# Patient Record
Sex: Female | Born: 1970
Health system: Southern US, Community
[De-identification: ages and names within clinical notes are randomized; demographics above are authoritative.]

## PROBLEM LIST (undated history)

## (undated) DIAGNOSIS — H5000 Unspecified esotropia: Secondary | ICD-10-CM

## (undated) DIAGNOSIS — G43909 Migraine, unspecified, not intractable, without status migrainosus: Secondary | ICD-10-CM

## (undated) DIAGNOSIS — F32A Depression, unspecified: Secondary | ICD-10-CM

## (undated) DIAGNOSIS — F329 Major depressive disorder, single episode, unspecified: Secondary | ICD-10-CM

## (undated) DIAGNOSIS — H532 Diplopia: Secondary | ICD-10-CM

## (undated) DIAGNOSIS — F419 Anxiety disorder, unspecified: Secondary | ICD-10-CM

## (undated) DIAGNOSIS — J302 Other seasonal allergic rhinitis: Secondary | ICD-10-CM

## (undated) HISTORY — DX: Anxiety disorder, unspecified: F41.9

## (undated) HISTORY — DX: Major depressive disorder, single episode, unspecified: F32.9

## (undated) HISTORY — DX: Diplopia: H53.2

## (undated) HISTORY — DX: Depression, unspecified: F32.A

---
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
## 1999-10-28 ENCOUNTER — Encounter: Admission: RE | Admit: 1999-10-28 | Discharge: 1999-10-28 | Payer: Self-pay | Admitting: Obstetrics and Gynecology

## 1999-10-28 ENCOUNTER — Encounter: Payer: Self-pay | Admitting: Obstetrics and Gynecology

## 2000-01-04 ENCOUNTER — Other Ambulatory Visit: Admission: RE | Admit: 2000-01-04 | Discharge: 2000-01-04 | Payer: Self-pay | Admitting: Obstetrics & Gynecology

## 2000-04-30 ENCOUNTER — Encounter: Payer: Self-pay | Admitting: Obstetrics and Gynecology

## 2000-04-30 ENCOUNTER — Encounter: Admission: RE | Admit: 2000-04-30 | Discharge: 2000-04-30 | Payer: Self-pay | Admitting: Obstetrics and Gynecology

## 2000-08-01 ENCOUNTER — Inpatient Hospital Stay (HOSPITAL_COMMUNITY): Admission: AD | Admit: 2000-08-01 | Discharge: 2000-08-03 | Payer: Self-pay | Admitting: Obstetrics and Gynecology

## 2000-10-18 ENCOUNTER — Encounter: Payer: Self-pay | Admitting: Obstetrics and Gynecology

## 2000-10-18 ENCOUNTER — Encounter: Admission: RE | Admit: 2000-10-18 | Discharge: 2000-10-18 | Payer: Self-pay | Admitting: Obstetrics and Gynecology

## 2001-01-08 ENCOUNTER — Other Ambulatory Visit: Admission: RE | Admit: 2001-01-08 | Discharge: 2001-01-08 | Payer: Self-pay | Admitting: Obstetrics and Gynecology

## 2001-01-10 ENCOUNTER — Encounter: Payer: Self-pay | Admitting: Obstetrics and Gynecology

## 2001-01-10 ENCOUNTER — Encounter: Admission: RE | Admit: 2001-01-10 | Discharge: 2001-01-10 | Payer: Self-pay | Admitting: Obstetrics and Gynecology

## 2001-04-09 ENCOUNTER — Encounter: Admission: RE | Admit: 2001-04-09 | Discharge: 2001-04-09 | Payer: Self-pay | Admitting: Obstetrics and Gynecology

## 2001-04-09 ENCOUNTER — Encounter: Payer: Self-pay | Admitting: Obstetrics and Gynecology

## 2002-01-09 ENCOUNTER — Other Ambulatory Visit: Admission: RE | Admit: 2002-01-09 | Discharge: 2002-01-09 | Payer: Self-pay | Admitting: *Deleted

## 2002-12-03 ENCOUNTER — Inpatient Hospital Stay (HOSPITAL_COMMUNITY): Admission: AD | Admit: 2002-12-03 | Discharge: 2002-12-05 | Payer: Self-pay | Admitting: Obstetrics and Gynecology

## 2003-01-16 ENCOUNTER — Other Ambulatory Visit: Admission: RE | Admit: 2003-01-16 | Discharge: 2003-01-16 | Payer: Self-pay | Admitting: Obstetrics and Gynecology

## 2004-02-05 ENCOUNTER — Other Ambulatory Visit: Admission: RE | Admit: 2004-02-05 | Discharge: 2004-02-05 | Payer: Self-pay | Admitting: Obstetrics and Gynecology

## 2005-02-06 ENCOUNTER — Other Ambulatory Visit: Admission: RE | Admit: 2005-02-06 | Discharge: 2005-02-06 | Payer: Self-pay | Admitting: Obstetrics and Gynecology

## 2005-12-23 ENCOUNTER — Inpatient Hospital Stay (HOSPITAL_COMMUNITY): Admission: AD | Admit: 2005-12-23 | Discharge: 2005-12-25 | Payer: Self-pay | Admitting: Obstetrics and Gynecology

## 2006-12-23 ENCOUNTER — Emergency Department (HOSPITAL_COMMUNITY): Admission: EM | Admit: 2006-12-23 | Discharge: 2006-12-23 | Payer: Self-pay | Admitting: Emergency Medicine

## 2007-09-10 ENCOUNTER — Ambulatory Visit (HOSPITAL_BASED_OUTPATIENT_CLINIC_OR_DEPARTMENT_OTHER): Admission: RE | Admit: 2007-09-10 | Discharge: 2007-09-10 | Payer: Self-pay | Admitting: Orthopaedic Surgery

## 2007-09-10 HISTORY — PX: SHOULDER ARTHROSCOPY W/ LABRAL REPAIR: SHX2399

## 2008-03-06 HISTORY — PX: OTHER SURGICAL HISTORY: SHX169

## 2010-07-19 NOTE — Op Note (Signed)
Natalie Hurst, Natalie Hurst               ACCOUNT NO.:  [id number]   MEDICAL RECORD NO.:  [id number]          PATIENT TYPE:  AMB   LOCATION:  DSC                          FACILITY:  [facility]   PHYSICIAN:  Lubertha Basque. Dalldorf, M.D.DATE OF BIRTH:  [birth year]   DATE OF PROCEDURE:  DATE OF DISCHARGE:                               OPERATIVE REPORT   PREOPERATIVE DIAGNOSIS:  Left shoulder anterior labral tear.   POSTOPERATIVE DIAGNOSIS:  Left shoulder anterior labral tear.   PROCEDURE:  1. Left shoulder arthroscopic debridement.  2. Left shoulder arthroscopic labral repair.   ANESTHESIA:  General.   ATTENDING SURGEON:  Lubertha Basque. Jerl Santos, MD.   ASSISTANT SURGEON:  Lindwood Qua, PA   INDICATIONS FOR PROCEDURE:  The patient is a 40 year old woman who has  sustained three anterior dislocations of her shoulder at this point.  She has gone through a formal physical therapy program after  immobilization and despite this did suffer her second and third  dislocations.  She has a shoulder which is limiting to her in that she  is afraid it will come out taking care of her children and during  exercise.  She is offered arthroscopic repair.  Informed operative  consent was obtained after discussion of the possible complications of  reaction to anesthesia, infection, neurovascular injury, and recurrent  dislocations.   SUMMARY OF FINDINGS AND PROCEDURE:  Under general anesthesia, an  arthroscopy of left shoulder was performed.  Glenohumeral joint showed  no degenerative changes and the biceps tendon and rotator cuff were  intact.  She did have complete detachment of the anteroinferior labrum  which was flipped down into the axillary recess.  We performed a  debridement of the anteroinferior aspect of her glenoid and created a  bleeding bed of bone to which were repaired the labrum with two reverse  mattress sutures of FiberWire secured with Arthrex Pushlock anchors.  Bryna Colander assisted throughout  and was invaluable to the completion  of the case in that he helped position and retract and pass the  instruments while I performed the procedure.  His presence made this  case possible in an arthroscopic fashion.   DESCRIPTION OF PROCEDURE:  The patient was taken to the operating suite  where general anesthetic was applied without difficulty.  She was  positioned in beach-chair position and prepped and draped in normal  sterile fashion.  After administration of IV Kefzol, an arthroscopy of  the left shoulder was performed through a total of three portals.  Findings were as noted above.  Procedure consisted of debridement  followed by a labral repair.  We created a fresh bleeding bed of bone on  the anteroinferior aspect the labrum with a shaver and elevator and bur.  I located the anteroinferior glenohumeral ligament and we passed sutures  through this using the suture lasso device.  We made double passes  creating reverse mattress sutures x2 of #2 FiberWire.  I then advanced  this slightly in a superior direction and repaired with two of the 3.5-  mm push lock Arthrex anchors.  This seemed to invert the labrum  nicely  and created a nice bumper on the anteroinferior aspect of the glenoid.  Arthroscopic equipment was removed.  The shoulder was injected with  Marcaine with epinephrine and morphine.  Simple sutures of nylon was  used to loosely reapproximate the portals followed by Adaptic and dry  gauze dressing with tape.  Estimated blood loss and intraoperative  fluids obtained from anesthesia records.   DISPOSITION:  The patient was extubated in the operating room and taken  to recovery room in stable addition.  She was to go home same day and  follow the office closely.  I will contact her by phone tonight.      Lubertha Basque Jerl Santos, M.D.  Electronically Signed     PGD/MEDQ  D:  09/10/2007  T:  09/10/2007  Job:  [id number]

## 2010-07-22 NOTE — H&P (Signed)
NAME:  Natalie Hurst, Natalie Hurst                         ACCOUNT NO.:  [id number]   MEDICAL RECORD NO.:  [id number]                   PATIENT TYPE:  INP   LOCATION:  9167                                 FACILITY:  [facility]   PHYSICIAN:  Naima A. Dillard, M.D.              DATE OF BIRTH:  Jul 09, 1970   DATE OF ADMISSION:  12/03/2002  DATE OF DISCHARGE:                                HISTORY & PHYSICAL   HISTORY OF PRESENT ILLNESS:  Natalie Hurst is a 40 year old married white  female, G3, P2-0-0-2, at 2 and 3/7ths weeks, who presents with spontaneous  rupture of membranes at 9 p.m.  She reports uterine contractions that began  soon after her membranes ruptured.  She denies bleeding, headache, nausea,  vomiting, or visual disturbances.  Her pregnancy has been followed by the  [facility] Certified Nurse Midwife Service, and has been  remarkable for (1) history of depression, (2) positive group B strep, (3)  history of long cycles.  Her OB labs were collected on April 28, 2002.  Hemoglobin 11.4, hematocrit 34.9, platelets 258,000, blood type A+, antibody  negative, RPR nonreactive, rubella immune, hepatitis B surface antigen  negative.  Pap smear within normal limits in November of 2003.  Gonorrhea  negative.  Chlamydia negative.  Toxoplasmosis labs negative.  Maternal serum  alpha fetoprotein was within normal limits.  Her one hour Glucola on September 10, 2002 was 109, and hemoglobin at that time was 12.5.  Culture of the vaginal  tract for group B strep on November 06, 2002 was positive, and gonorrhea and  Chlamydia were negative from that same date.   HISTORY OF PRESENT PREGNANCY:  She presented for care at [facility] on  April 28, 2002 at approximately nine weeks gestation.  Pregnancy  ultrasonography at [redacted] weeks gestation showed growth consistent with previous  dating.  The rest of her prenatal care was unremarkable.   OBSTETRICAL HISTORY:  She is a gravida 3, para 2-0-0-2.  In  September of  1999, she had a vaginal birth for a female infant weighing 7 pounds and 12  ounces, at [redacted] weeks gestation after 21 hours in labor.  She had an epidural  for anesthesia.  His name was Natalie Hurst.  In May of 2002, she had a vaginal  delivery of a female infant weighing 7 pounds and 13 ounces at 40 and 3/7ths  weeks gestation after six hours in labor.  She had nothing for anesthesia.  The infant's name was Natalie Hurst.  She had positive group B strep with that  pregnancy.  She reports postpartum blues for eight weeks after her first  delivery, but she did not take medications for that.   ALLERGIES:  She has no medication allergies.  She has a side effect from  medication RONDEC which results in increased heart rate.   MEDICAL HISTORY:  She reports having had the usual childhood illnesses.  SURGICAL HISTORY:  Remarkable for wisdom teeth extraction.   FAMILY MEDICAL HISTORY:  Remarkable for maternal grandfather with history of  MI.  Father and mother with hypertension.  Paternal grandfather with type 2  diabetes.  Maternal aunt with a tumor on her neck.  Mother with epilepsy as  a child.  Depression runs in her family.   GENETIC HISTORY:  Remarkable for maternal great uncle with multiple  sclerosis.   SOCIAL HISTORY:  The patient is married to the father of the baby.  His name  is Natalie Hurst.  He is involved and supportive.  The patient and father of the baby  are both college educated and employed.  They are of the Catholic faith.  They deny any alcohol, tobacco, or illicit drug use with the pregnancy.   OBJECTIVE DATA:  VITAL SIGNS:  Stable.  She is afebrile.  HEENT:  Grossly within normal limits.  CHEST:  Clear to auscultation.  HEART:  Regular rate and rhythm.  ABDOMEN:  Gravid and contoured with fundal height extending approximately 40  cm above the pubic symphysis.  Fetal heart rate is reassuring.  Uterine  contractions every 2-3 minutes.  PELVIC:  Cervix is 6-7 cm per R.N. with  copious thin meconium-stained fluid  present.  EXTREMITIES:  Within normal limits.   ASSESSMENT:  1. Intrauterine pregnancy at term.  2. Spontaneous rupture of membranes.  3. Active labor.   PLAN:  1. Admit to birthing suites for consult with Dr. Normand Sloop.  2. Routine CNM orders.  3. Ampicillin for group B strep prophylaxis.     Natalie Hurst, C.N.M.                     Naima A. Normand Sloop, M.D.    KS/MEDQ  D:  12/03/2002  T:  12/03/2002  Job:  [id number]

## 2010-07-22 NOTE — H&P (Signed)
[facility]  Patient:    Natalie Hurst, Natalie Hurst                        MRN: [id number] Adm. Date:  08/01/00 Attending:  Janine Limbo, M.D. Dictator:   Mack Guise, C.N.M.                         History and Physical                                [name] is a 40 year old gravida 2, para 1-0-0-1 at 68 5/7 weeks, EDD Jul 27, 2000 as determined by LMP dates confirmed with pregnancy ultrasonography.  She presents with increasing signs and symptoms of labor.  Reports positive fetal movement.  No bleeding.  No rupture of membranes.  Denies any headache, visual changes, or epigastric pain.  Her pregnancy has been followed by the C.N.M. service at [facility] and is remarkable for left breast mass, history of depression, group B strep positive.  This patient was initially evaluated at the office of [facility] on January 04, 2000 at approximately 7 1/[redacted] weeks gestation.  Her hemoglobin and hematocrit 12.0 and 35.6, platelets 275,000.  Blood type and Rh A+.  Antibody screen negative. Toxo titers negative.  VDRL nonreactive.  Rubella immune.  Hepatitis B surface antigen negative.  Pap smear within normal limits.  GC and chlamydia negative and negative at AFP/free beta hCG within normal range on March 1, 200_ at 28 weeks.  One hour glucose challenge 83.  Hemoglobin 10.4.  At 36 weeks the culture of the vaginal tract is positive for group B strep.  Patient has been followed for a left breast mass.  She was seen initially by Dr. Gerrit Friends prior to pregnancy and has been followed throughout with followup ultrasounds.  By ultrasound of the breast is felt to be a probable fibroadenoma and has not caused the patient any further problems.  Her pregnancy has been unremarkable with size being equal to dates.  Throughout her pregnancy she has been normotensive with no proteinuria.  PAST OBSTETRICAL HISTORY:     In 1999 normal spontaneous vaginal delivery with the birth of a 7 pound  12 ounce female infant with no complications.  PAST MEDICAL HISTORY:         History of postpartum depression 10 years ago not taking any medications at the present time.  ALLERGIES:                    No known drug allergies.  SOCIAL HISTORY:               Patient denies the use of tobacco, alcohol, or illicit drugs.  FAMILY HISTORY:               Maternal grandfather and paternal grandfather with heart disease and stroke.  Patients father and maternal grandmother with hypertension.  Paternal grandfather with diabetes.  Patients sister is hypothyroid.  Her aunt is hyperthyroid.  Patients mother with a history of epilepsy as a child.  Paternal grandmother with arthritis.  Patients maternal aunt has a tumor at the base of her neck.  PAST SURGICAL HISTORY:        Wisdom teeth.  GENETIC HISTORY:              There is no family history of genetic or  chromosomal disorders, children that died in infancy or that were born with birth defects.                                Natalie Hurst is a 40 year old Caucasian female. She is married to [employer].  He is involved and supportive.  They are of the Catholic faith.  REVIEW OF SYSTEMS:            There are no signs or symptoms suggestive of focal or systemic disease and the patient is typical of one with a uterine pregnancy at term in active labor.  PHYSICAL EXAMINATION  VITAL SIGNS:                  Stable, afebrile.  HEENT:                        Unremarkable.  HEART:                        Regular rate and rhythm.  LUNGS:                        Clear.  ABDOMEN:                      Gravid in its contour.  Uterine fundus is noted to extend 40 cm above the level of the pubic symphysis.  ______ maneuver finds the infant to be in a longitudinal lie, cephalic presentation, and the estimated fetal weight is 8 pounds.  PELVIC:                       Digital examination of the cervix finds it to be 6 cm dilated, 90% effaced with the  cephalic presenting part at a -1 station with a bulging bag of waters.  The baseline of the fetal heart rate monitor is reactive and reassuring.  Patient is contracting every three to four minutes.  EXTREMITIES:                  Edema 1+.  DTRs are 1+ with no clonus.  ASSESSMENT:                   Intrauterine pregnancy at term, active labor.  PLAN:                         Admit per Dr. Marline Backbone.  Routine C.N.M. orders.  Penicillin-G prophylaxis for positive group B strep.  Anticipate spontaneous vaginal delivery. DD:  08/01/00 TD:  08/01/00 Job: [id number]

## 2010-07-22 NOTE — H&P (Signed)
Natalie Hurst, Natalie Hurst               ACCOUNT NO.:  [id number]   MEDICAL RECORD NO.:  [id number]          PATIENT TYPE:  MAT   LOCATION:  MATC                          FACILITY:  [facility]   PHYSICIAN:  Crist Fat. Rivard, M.D. DATE OF BIRTH:  15-Jul-1970   DATE OF ADMISSION:  12/23/2005  DATE OF DISCHARGE:                                HISTORY & PHYSICAL   HISTORY OF PRESENT ILLNESS:  This is a 40 year old gravida 5, para 3-1-0-3  at 40-1/7 weeks who presents with contractions every 5 minutes for 3 hours.  She denies leaking or bleeding and reports positive fetal movement.  The  pregnancy has been followed by the Nurse Midwife Service and remarkable for:  1. History of depression.  2. History of long cycles.  3. History of fibroadenoma.  4. Recent EAV for multiple anomaly.  There are limited records available with this pregnancy at this time.   REVIEW OF SYSTEMS:  The patient reports contractions for 3 hours with no  leaking of fluid and no bleeding.   HISTORY OF CURRENT PREGNANCY:  The patient entered care at 10-weeks  gestation.  She had first trimester genetic screen at [facility], which was  normal.  She had an 18-week anatomy ultrasound, which was normal and  __________  was normal also.  She had a Glucola done at 28-weeks, which was  96.  She had a group B Strep, which was negative at term.  Her OB history is  remarkable for vaginal birth in 1999 of a female infant weighting 7 lb, 12 oz  at 38-weeks gestation, named Lorriane Shire.  She had a vaginal delivery in 2002 of  a female infant weighing 7 lb, 13 oz at 40-3/[redacted] weeks gestation named Julia.  She had some postpartum blues after that pregnancy.  She had a vaginal  delivery in 2004 at 40-3/[redacted] weeks gestation with no complications.  She had  an induced abortion in 2006 for multiple anomalies at 21 weeks at [facility] and has done well coping since then and presents today in labor.   ALLERGIES:  None; however, RONDEC results in increased heart  rate.   PAST MEDICAL HISTORY:  Unremarkable.   PAST SURGICAL HISTORY:  Remarkable for wisdom teeth extraction.   FAMILY HISTORY:  Remarkable for grandfather with MI, father and mother with  hypertension, grandfather with diabetes, aunt with neck tumor, mother with  epilepsy as a child and depression runs in her family.   GENETIC HISTORY:  Is remarkable for a maternal great-uncle with multiple  sclerosis and her last pregnancy with multiple anomalies.   SOCIAL HISTORY:  The patient is married to Theodis Shove who is involved and  supportive.  They are both college-educated and employed.  She is of the  [employer].  She denies any alcohol, tobacco or drug use.   OBJECTIVE:  VITAL SIGNS:  Stable.  Afebrile.  HEENT:  Within normal limits.  NECK:  Thyroid normal, not enlarged.  CHEST:  Clear to auscultation.  HEART:  Regular rate and rhythm.  ABDOMEN:  Gravid at __________  shows reactive fetal heartbeat with  contractions every 5 minutes.  Cervix is 5 cm, 70% effaced, minus 1 station  with a vertex presentation.  EXTREMITIES:  Within normal limits.   ASSESSMENT:  1. Intrauterine pregnancy at 40 and 1/7 weeks.  2. Active labor.  3. Group B Strep negative.   PLAN:  1. Admit to birthing suite.  Dr. Estanislado Pandy notified.  2. Routine __________ .  3. Consider amniotomy.  4. May want epidural.      Marie L. Williams, C.N.M.      Crist Fat Rivard, M.D.  Electronically Signed    MLW/MEDQ  D:  12/23/2005  T:  12/24/2005  Job:  [id number]

## 2010-12-01 LAB — POCT HEMOGLOBIN-HEMACUE: Hemoglobin: 13.3

## 2011-05-09 ENCOUNTER — Other Ambulatory Visit: Payer: Self-pay | Admitting: Obstetrics and Gynecology

## 2011-05-09 DIAGNOSIS — Z1231 Encounter for screening mammogram for malignant neoplasm of breast: Secondary | ICD-10-CM

## 2011-06-19 ENCOUNTER — Ambulatory Visit
Admission: RE | Admit: 2011-06-19 | Discharge: 2011-06-19 | Disposition: A | Discharge status: Home / Self Care | Payer: BC Managed Care – PPO | Source: Ambulatory Visit | Attending: Obstetrics and Gynecology | Admitting: Obstetrics and Gynecology

## 2011-06-19 DIAGNOSIS — Z1231 Encounter for screening mammogram for malignant neoplasm of breast: Secondary | ICD-10-CM

## 2011-07-27 ENCOUNTER — Ambulatory Visit: Payer: Self-pay | Admitting: Obstetrics and Gynecology

## 2011-07-27 ENCOUNTER — Ambulatory Visit (INDEPENDENT_AMBULATORY_CARE_PROVIDER_SITE_OTHER): Payer: BC Managed Care – PPO | Admitting: Obstetrics and Gynecology

## 2011-07-27 ENCOUNTER — Encounter: Payer: Self-pay | Admitting: Obstetrics and Gynecology

## 2011-07-27 VITALS — BP 90/62 | HR 68 | Resp 16 | Ht 63.0 in | Wt 156.0 lb

## 2011-07-27 DIAGNOSIS — Z124 Encounter for screening for malignant neoplasm of cervix: Secondary | ICD-10-CM

## 2011-07-27 DIAGNOSIS — N921 Excessive and frequent menstruation with irregular cycle: Secondary | ICD-10-CM

## 2011-07-27 DIAGNOSIS — Z139 Encounter for screening, unspecified: Secondary | ICD-10-CM

## 2011-07-27 NOTE — Progress Notes (Signed)
Contraception vasectomy Last pap 07/2010 wnl Last Mammo 06/19/2011 wnl Last Colonoscopy none Last Dexa Scan none Primary MD Catha Gosselin Abuse at Home none  No complaints but reports her last cycle was only 2 days compared to 5-7 days and are normal he is.  Her husband has a vasectomy but never got E. followup testing done.  Filed Vitals:   07/27/11 1441  BP: 90/62  Pulse: 68  Resp: 16   ROS: noncontributory  Physical Examination: General appearance - alert, well appearing, and in no distress Neck - supple, no significant adenopathy Chest - clear to auscultation, no wheezes, rales or rhonchi, symmetric air entry Heart - normal rate and regular rhythm Abdomen - soft, nontender, nondistended, no masses or organomegaly Breasts - breasts appear normal, no suspicious masses, no skin or nipple changes or axillary nodes Pelvic - normal external genitalia, vulva, vagina, cervix, uterus and adnexa Back exam - no CVAT Extremities - no edema, redness or tenderness in the calves or thighs  Assessment and plan Metrorrhagia check labs Will call patient tomorrow if positive pregnancy test Patient instructed to have husband do his followup testing or Pap smear done If bleeding issues persist, rto sooner RTO for AEX

## 2011-07-28 ENCOUNTER — Telehealth: Payer: Self-pay | Admitting: Obstetrics and Gynecology

## 2011-07-28 LAB — CBC
Hemoglobin: 12.3 g/dL (ref 12.0–15.0)
MCH: 29.4 pg (ref 26.0–34.0)
MCHC: 32.8 g/dL (ref 30.0–36.0)
Platelets: 322 10*3/uL (ref 150–400)
RDW: 13.4 % (ref 11.5–15.5)

## 2011-07-28 LAB — HCG, QUANTITATIVE, PREGNANCY: hCG, Beta Chain, Quant, S: <2 m[IU]/mL

## 2011-07-28 LAB — TSH: TSH: 1.859 u[IU]/mL (ref 0.350–4.500)

## 2011-07-28 LAB — VITAMIN D 25 HYDROXY (VIT D DEFICIENCY, FRACTURES): Vit D, 25-Hydroxy: 38 ng/mL (ref 30–89)

## 2011-07-28 NOTE — Telephone Encounter (Signed)
TC to pt. Per Dr Alinda Sierras informed pregnancy test negative.

## 2011-08-02 LAB — PAP IG W/ RFLX HPV ASCU

## 2012-05-20 ENCOUNTER — Other Ambulatory Visit: Payer: Self-pay

## 2012-05-20 DIAGNOSIS — Z1231 Encounter for screening mammogram for malignant neoplasm of breast: Secondary | ICD-10-CM

## 2012-06-21 ENCOUNTER — Ambulatory Visit
Admission: RE | Admit: 2012-06-21 | Discharge: 2012-06-21 | Disposition: A | Discharge status: Home / Self Care | Payer: BC Managed Care – PPO | Source: Ambulatory Visit

## 2012-06-21 DIAGNOSIS — Z1231 Encounter for screening mammogram for malignant neoplasm of breast: Secondary | ICD-10-CM

## 2013-05-21 ENCOUNTER — Other Ambulatory Visit: Payer: Self-pay

## 2013-05-21 DIAGNOSIS — Z1231 Encounter for screening mammogram for malignant neoplasm of breast: Secondary | ICD-10-CM

## 2013-06-27 ENCOUNTER — Encounter (INDEPENDENT_AMBULATORY_CARE_PROVIDER_SITE_OTHER): Payer: Self-pay

## 2013-06-27 ENCOUNTER — Ambulatory Visit
Admission: RE | Admit: 2013-06-27 | Discharge: 2013-06-27 | Disposition: A | Discharge status: Home / Self Care | Payer: Self-pay | Source: Ambulatory Visit

## 2013-06-27 DIAGNOSIS — Z1231 Encounter for screening mammogram for malignant neoplasm of breast: Secondary | ICD-10-CM

## 2014-01-07 ENCOUNTER — Other Ambulatory Visit: Payer: Self-pay | Admitting: Family Medicine

## 2014-01-07 ENCOUNTER — Ambulatory Visit
Admission: RE | Admit: 2014-01-07 | Discharge: 2014-01-07 | Disposition: A | Discharge status: Home / Self Care | Payer: No Typology Code available for payment source | Source: Ambulatory Visit | Attending: Family Medicine | Admitting: Family Medicine

## 2014-01-07 DIAGNOSIS — R52 Pain, unspecified: Secondary | ICD-10-CM

## 2014-01-16 ENCOUNTER — Ambulatory Visit: Payer: No Typology Code available for payment source | Attending: Family Medicine

## 2014-01-16 DIAGNOSIS — M25659 Stiffness of unspecified hip, not elsewhere classified: Secondary | ICD-10-CM | POA: Diagnosis not present

## 2014-01-16 DIAGNOSIS — R5381 Other malaise: Secondary | ICD-10-CM | POA: Insufficient documentation

## 2014-01-16 DIAGNOSIS — M545 Low back pain: Secondary | ICD-10-CM | POA: Diagnosis present

## 2014-01-21 ENCOUNTER — Ambulatory Visit: Payer: No Typology Code available for payment source | Admitting: Physical Therapy

## 2014-01-21 DIAGNOSIS — M545 Low back pain: Secondary | ICD-10-CM | POA: Diagnosis not present

## 2014-01-27 ENCOUNTER — Ambulatory Visit: Payer: No Typology Code available for payment source | Admitting: Physical Therapy

## 2014-01-27 DIAGNOSIS — M545 Low back pain: Secondary | ICD-10-CM | POA: Diagnosis not present

## 2014-02-03 ENCOUNTER — Ambulatory Visit: Payer: No Typology Code available for payment source | Attending: Family Medicine | Admitting: Physical Therapy

## 2014-02-03 DIAGNOSIS — M25659 Stiffness of unspecified hip, not elsewhere classified: Secondary | ICD-10-CM | POA: Diagnosis not present

## 2014-02-03 DIAGNOSIS — R5381 Other malaise: Secondary | ICD-10-CM | POA: Insufficient documentation

## 2014-02-03 DIAGNOSIS — M545 Low back pain: Secondary | ICD-10-CM | POA: Diagnosis present

## 2014-02-05 ENCOUNTER — Ambulatory Visit: Payer: No Typology Code available for payment source | Admitting: Physical Therapy

## 2014-02-05 DIAGNOSIS — M545 Low back pain: Secondary | ICD-10-CM | POA: Diagnosis not present

## 2014-02-10 ENCOUNTER — Ambulatory Visit: Payer: No Typology Code available for payment source | Admitting: Physical Therapy

## 2014-02-10 DIAGNOSIS — M545 Low back pain: Secondary | ICD-10-CM | POA: Diagnosis not present

## 2014-02-12 ENCOUNTER — Ambulatory Visit: Payer: No Typology Code available for payment source | Admitting: Physical Therapy

## 2014-02-12 DIAGNOSIS — M545 Low back pain: Secondary | ICD-10-CM | POA: Diagnosis not present

## 2014-02-17 ENCOUNTER — Ambulatory Visit: Payer: No Typology Code available for payment source | Admitting: Physical Therapy

## 2014-02-17 DIAGNOSIS — M545 Low back pain: Secondary | ICD-10-CM | POA: Diagnosis not present

## 2014-02-19 ENCOUNTER — Ambulatory Visit: Payer: No Typology Code available for payment source | Admitting: Physical Therapy

## 2014-06-25 ENCOUNTER — Other Ambulatory Visit: Payer: Self-pay

## 2014-06-25 DIAGNOSIS — Z1231 Encounter for screening mammogram for malignant neoplasm of breast: Secondary | ICD-10-CM

## 2014-06-30 ENCOUNTER — Ambulatory Visit
Admission: RE | Admit: 2014-06-30 | Discharge: 2014-06-30 | Disposition: A | Discharge status: Home / Self Care | Payer: No Typology Code available for payment source | Source: Ambulatory Visit

## 2014-06-30 DIAGNOSIS — Z1231 Encounter for screening mammogram for malignant neoplasm of breast: Secondary | ICD-10-CM

## 2014-09-17 ENCOUNTER — Other Ambulatory Visit: Payer: Self-pay | Admitting: Family Medicine

## 2014-09-17 DIAGNOSIS — M545 Low back pain: Secondary | ICD-10-CM

## 2014-09-25 ENCOUNTER — Ambulatory Visit
Admission: RE | Admit: 2014-09-25 | Discharge: 2014-09-25 | Disposition: A | Discharge status: Home / Self Care | Payer: No Typology Code available for payment source | Source: Ambulatory Visit | Attending: Family Medicine | Admitting: Family Medicine

## 2014-09-25 DIAGNOSIS — M545 Low back pain: Secondary | ICD-10-CM

## 2015-05-10 ENCOUNTER — Other Ambulatory Visit: Payer: Self-pay | Admitting: Ophthalmology

## 2015-05-10 DIAGNOSIS — R51 Headache: Secondary | ICD-10-CM

## 2015-05-10 DIAGNOSIS — R519 Headache, unspecified: Secondary | ICD-10-CM

## 2015-05-10 DIAGNOSIS — H532 Diplopia: Secondary | ICD-10-CM

## 2015-05-16 ENCOUNTER — Ambulatory Visit
Admission: RE | Admit: 2015-05-16 | Discharge: 2015-05-16 | Disposition: A | Discharge status: Home / Self Care | Payer: 59 | Source: Ambulatory Visit | Attending: Ophthalmology | Admitting: Ophthalmology

## 2015-05-16 DIAGNOSIS — R519 Headache, unspecified: Secondary | ICD-10-CM

## 2015-05-16 DIAGNOSIS — R51 Headache: Secondary | ICD-10-CM

## 2015-05-16 DIAGNOSIS — H532 Diplopia: Secondary | ICD-10-CM

## 2015-05-16 MED ORDER — GADOBENATE DIMEGLUMINE 529 MG/ML IV SOLN
14.0000 mL | Freq: Once | INTRAVENOUS | Status: AC | PRN
  Administered 2015-05-16: 14 mL via INTRAVENOUS

## 2015-05-27 ENCOUNTER — Encounter: Payer: Self-pay | Admitting: Neurology

## 2015-05-27 ENCOUNTER — Ambulatory Visit (INDEPENDENT_AMBULATORY_CARE_PROVIDER_SITE_OTHER): Payer: 59 | Admitting: Neurology

## 2015-05-27 VITALS — BP 114/72 | HR 77 | Ht 63.0 in | Wt 163.0 lb

## 2015-05-27 DIAGNOSIS — H532 Diplopia: Secondary | ICD-10-CM

## 2015-05-27 DIAGNOSIS — G441 Vascular headache, not elsewhere classified: Secondary | ICD-10-CM

## 2015-05-27 DIAGNOSIS — R519 Headache, unspecified: Secondary | ICD-10-CM | POA: Insufficient documentation

## 2015-05-27 DIAGNOSIS — R51 Headache: Secondary | ICD-10-CM

## 2015-05-27 MED ORDER — TOPIRAMATE 25 MG PO TABS: ORAL_TABLET | ORAL | Status: DC

## 2015-05-27 NOTE — Patient Instructions (Signed)
   Topamax (topiramate) is a seizure medication that has an FDA approval for seizures and for migraine headache. Potential side effects of this medication include weight loss, cognitive slowing, tingling in the fingers and toes, and carbonated drinks will taste bad. If any significant side effects are noted on this drug, please contact our office.  Diplopia Diplopia is the condition of having double vision or seeing two of a single object. There are many causes of diplopia. Some are not dangerous and can be easily corrected. Diplopia may also be a symptom of a serious medical problem. There are two types of diplopia.  Monocular diplopia. This is double vision that affects only one eye. Monocular diplopia is often caused by a clouding of the lens in your eye (cataract) or by disruptions in the way that your eye focuses light.  Binocular diplopia. This is double vision that affects both eyes. However, when you shut one eye, the double vision will go away. Binocular diplopia may be more serious. It can be caused by:  Problems with the nerves or muscles that are responsible for eye movement.  Neurologic diseases.  Thyroid problems.  Tumors.  An infection near your eyes.  A stroke. You may need to see a health care provider who specializes in eye conditions (ophthalmologist) or a nerve specialist (neurologist) to find the cause. HOME CARE INSTRUCTIONS  Tell your health care provider about any changes in your vision.  Do not drive or operate heavy machinery if diplopia interferes with your vision.  Keep all follow-up visits as directed by your health care provider. This is important. SEEK MEDICAL CARE IF:  Your diplopia gets worse.  You develop any other symptoms along with your diplopia, such as:  Weakness.  Numbness.  Headache.  Eye pain.  Clumsiness.  Nausea.  Drooping eyelids.  Abnormal movement of one of your eyes. SEEK IMMEDIATE MEDICAL CARE IF:  You have sudden  vision loss.  You suddenly get a very bad headache.  You have sudden weakness or numbness.  You suddenly lose the ability to speak, understand speech, or both.   This information is not intended to replace advice given to you by your health care provider. Make sure you discuss any questions you have with your health care provider.   Document Released: 12/23/2003 Document Revised: 07/07/2014 Document Reviewed: 01/14/2014 Elsevier Interactive Patient Education [employer]2016 [employer]

## 2015-05-27 NOTE — Progress Notes (Signed)
Reason for visit: Headache  Referring physician: Dr. [name] is a 45 y.o. female  History of present illness:  Natalie Hurst is a 45 year old right-handed white female with a history of congenital diplopia. The patient has had horizontal double vision throughout her entire life, usually worse when looking to the left, better when looking to the right. The patient has not had any functional limitations because of the double vision. She is able to operate a motor vehicle, watch TV, and read a book without too much difficulty. The patient denies any ptosis with the double vision, and no issues with chewing or swallowing. The patient has had some slight worsening of the double vision over time. She also has noted some intermittent headaches over the last 2 years, the headaches are generally in the right parietal area, occasionally with some spread of pain into the neck and shoulder area. The episodes were initially occurring once every 2 months or so, but in December 2016, the headaches converted to being daily in nature. They do not usually keep her from any activities of daily living. The patient has no photophobia or phonophobia with the headache, and no reports of nausea or vomiting. She does have some slight scalp tenderness. The patient denies any new numbness or weakness of the face, arms, or legs. She does have a history of right carpal tunnel syndrome surgery, she has some symptoms of carpal tunnel syndrome on the left hand. The patient reports some achy sensations in the left calf at night and some occasional tingling in the left foot. The patient has had MRI evaluation of the brain that shows evidence of a cisterna magna. Otherwise, the brain was normal. The patient comes to this office for an evaluation. She has noted that over-the-counter medication such as Tylenol or Advil do not help her headache. She has some chronic mild balance issues that have been present for her entire  life. She denies any issues controlling the bowels or the bladder.  Past Medical History  Diagnosis Date  . Anxiety   . Depression   . Diplopia 05/27/2015  . Headache 05/27/2015    Past Surgical History  Procedure Laterality Date  . Right wrist surgery    . Left shoulder surgery      Family History  Problem Relation Age of Onset  . Hypertension Mother   . Thyroid disease Mother   . Hypertension Father   . Heart disease Father   . Hypertension Brother   . Thyroid disease Sister     Social history:  reports that she has never smoked. She does not have any smokeless tobacco history on file. She reports that she drinks alcohol. She reports that she does not use illicit drugs.  Medications:  Prior to Admission medications   Medication Sig Start Date End Date Taking? Authorizing Provider  cetirizine (ZYRTEC) 10 MG tablet Take 10 mg by mouth daily.   Yes Historical Provider, MD  FLUoxetine (PROZAC) 20 MG capsule Take 20 mg by mouth daily.   Yes Historical Provider, MD      Allergies  Allergen Reactions  . Amoxicillin     Hives   . Other     Rondec- SO, heart races.     ROS:  Out of a complete 14 system review of symptoms, the patient complains only of the following symptoms, and all other reviewed systems are negative.  Birthmarks, moles Double vision Allergies Headache Depression, anxiety Restless legs  Blood pressure  114/72, pulse 77, height 5\' 3"  (1.6 m), weight 163 lb (73.936 kg).  Physical Exam  General: The patient is alert and cooperative at the time of the examination.  Eyes: Pupils are equal, round, and reactive to light. Discs are flat bilaterally.  Neck: The neck is supple, no carotid bruits are noted.  Respiratory: The respiratory examination is clear.  Cardiovascular: The cardiovascular examination reveals a regular rate and rhythm, no obvious murmurs or rubs are noted.   Neuromuscular: Range of movement of the cervical spine is full. Oh  crepitus is noted in the temporomandibular joints.  Skin: Extremities are without significant edema.  Neurologic Exam  Mental status: The patient is alert and oriented x 3 at the time of the examination. The patient has apparent normal recent and remote memory, with an apparently normal attention span and concentration ability.  Cranial nerves: Facial symmetry is present. There is good sensation of the face to pinprick and soft touch bilaterally. The strength of the facial muscles and the muscles to head turning and shoulder shrug are normal bilaterally. Speech is well enunciated, no aphasia or dysarthria is noted. Extraocular movements are full, but on primary gaze, there is slight medial deviation of the left eye. With extreme right gaze, there is an gaze nystagmus of the right eye. Visual fields are full. The tongue is midline, and the patient has symmetric elevation of the soft palate. No obvious hearing deficits are noted.  Motor: The motor testing reveals 5 over 5 strength of all 4 extremities. Good symmetric motor tone is noted throughout.  Sensory: Sensory testing is intact to pinprick, soft touch, vibration sensation, and position sense on all 4 extremities. No evidence of extinction is noted.  Coordination: Cerebellar testing reveals good finger-nose-finger and heel-to-shin bilaterally.  Gait and station: Gait is normal. Tandem gait is normal. Romberg is negative. No drift is seen.  Reflexes: Deep tendon reflexes are symmetric and normal bilaterally. Toes are downgoing bilaterally.   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 Otherwise unremarkable appearance of the brain.  * MRI scan images were reviewed online. I agree with the written report.     Assessment/Plan:  1. Chronic daily headache  2. Congenital diplopia  The patient indicates that there has been some slight worsening of her double vision has  occurred over the last several years. We will check blood work to ensure that there is not an underlying issue affecting the double vision. The patient is now having daily headaches, we will initiate treatment with Topamax, follow-up in about 3 months. The patient will contact our office if she has had problems with toleration of the medication or if the medication is ineffective.  Marlan Palau. Keith Ladajah Soltys MD 05/27/2015 7:35 PM  [facility] [address] [place], [place] [id number]  Phone [phone] Fax [phone]

## 2015-05-31 LAB — ACETYLCHOLINE RECEPTOR, BINDING: AChR Binding Ab, Serum: <0.03 nmol/L (ref 0.00–0.24)

## 2015-05-31 LAB — VITAMIN B12: VITAMIN B 12: 309 pg/mL (ref 211–946)

## 2015-05-31 LAB — TSH: TSH: 2.29 u[IU]/mL (ref 0.450–4.500)

## 2015-06-01 ENCOUNTER — Telehealth: Payer: Self-pay | Admitting: *Deleted

## 2015-06-01 NOTE — Telephone Encounter (Signed)
Spoke to pt and relayed results of labs.   She verbalized understanding.   She did relay that she noted drowsiness and constipation with taking the topamax.  She will monitor and see how she does.  Will call back prn.

## 2015-06-01 NOTE — Telephone Encounter (Signed)
-----   Message from York Spanielharles K Willis, MD sent at 05/31/2015  4:46 PM EDT -----  The blood work results are unremarkable. Please call the patient.  ----- Message -----    From: [facility] Lab Results In Interface    Sent: 05/28/2015   9:39 AM      To: York Spanielharles K Willis, MD

## 2015-08-20 ENCOUNTER — Other Ambulatory Visit: Payer: Self-pay | Admitting: Family Medicine

## 2015-08-20 DIAGNOSIS — R911 Solitary pulmonary nodule: Secondary | ICD-10-CM

## 2015-08-30 ENCOUNTER — Ambulatory Visit
Admission: RE | Admit: 2015-08-30 | Discharge: 2015-08-30 | Disposition: A | Discharge status: Home / Self Care | Payer: 59 | Source: Ambulatory Visit | Attending: Family Medicine | Admitting: Family Medicine

## 2015-08-30 ENCOUNTER — Other Ambulatory Visit: Payer: Self-pay | Admitting: Obstetrics and Gynecology

## 2015-08-30 DIAGNOSIS — R928 Other abnormal and inconclusive findings on diagnostic imaging of breast: Secondary | ICD-10-CM

## 2015-08-30 DIAGNOSIS — R911 Solitary pulmonary nodule: Secondary | ICD-10-CM

## 2015-08-30 MED ORDER — IOPAMIDOL (ISOVUE-300) INJECTION 61%
75.0000 mL | Freq: Once | INTRAVENOUS | Status: AC | PRN
  Administered 2015-08-30: 75 mL via INTRAVENOUS

## 2015-09-01 NOTE — H&P (Signed)
Natalie Hurst is an 45 y.o. female.   Chief Complaint: persistent congenital incommitant esotropia with diplopia. HPI: patient complains of double vision, headaches and esotropia.  Past Medical History  Diagnosis Date  . Anxiety   . Depression   . Diplopia 05/27/2015  . Headache 05/27/2015    Past Surgical History  Procedure Laterality Date  . Right wrist surgery    . Left shoulder surgery      Family History  Problem Relation Age of Onset  . Hypertension Mother   . Thyroid disease Mother   . Hypertension Father   . Heart disease Father   . Hypertension Brother   . Thyroid disease Sister    Social History:  reports that she has never smoked. She does not have any smokeless tobacco history on file. She reports that she drinks alcohol. She reports that she does not use illicit drugs.  Allergies:  Allergies  Allergen Reactions  . Amoxicillin     Hives   . Other     Rondec- SO, heart races.     No prescriptions prior to admission    No results found for this or any previous visit (from the past 48 hour(s)). Ct Chest W Contrast  08/30/2015  CLINICAL DATA:  Pulmonary nodule on chest x-ray. EXAM: CT CHEST WITH CONTRAST TECHNIQUE: Multidetector CT imaging of the chest was performed during intravenous contrast administration. CONTRAST:  75mL ISOVUE-300 IOPAMIDOL (ISOVUE-300) INJECTION 61% COMPARISON:  Chest x-ray 08/19/2015. FINDINGS: Mediastinum / Lymph Nodes: There is no axillary lymphadenopathy. No mediastinal lymphadenopathy. There is no hilar lymphadenopathy. The heart size is normal. No pericardial effusion. The esophagus has normal imaging features. Lungs / Pleura: No evidence for focal airspace consolidation. No pulmonary nodule or mass. No pulmonary edema or pleural effusion. The opacity in the right cardiophrenic angle on the recent chest x-ray is secondary to prominent epicardial fat pad. Upper Abdomen:  Unremarkable. MSK / Soft Tissues: Bone windows reveal no worrisome  lytic or sclerotic osseous lesions. IMPRESSION: Normal CT evaluation of the chest. The opacity in the right cardiophrenic angle seen on the recent chest x-ray is secondary to an epicardial fat pad. Electronically Signed   By: Kennith CenterEric  Mansell M.D.   On: 08/30/2015 15:43    Review of Systems  Constitutional: Negative.   Eyes: Positive for blurred vision and double vision.       Estropia  Respiratory: Negative.   Cardiovascular: Negative.   Gastrointestinal: Negative.   Genitourinary: Negative.   Musculoskeletal: Negative.   Skin: Negative.   Neurological: Positive for headaches.  Endo/Heme/Allergies: Negative.   Psychiatric/Behavioral: Negative.     There were no vitals taken for this visit. Physical Exam  Eyes:       Assessment/Plan Patient scheduled for right medial rectus posterior fixation suture placement and left lateral rectus resection with adjustable sutures for correction of diplopia and esotropia.   Eura Mccauslin A, MD 09/01/2015, 9:04 AM

## 2015-09-02 ENCOUNTER — Encounter: Payer: Self-pay | Admitting: Adult Health

## 2015-09-02 ENCOUNTER — Ambulatory Visit (INDEPENDENT_AMBULATORY_CARE_PROVIDER_SITE_OTHER): Payer: 59 | Admitting: Adult Health

## 2015-09-02 VITALS — BP 110/68 | HR 76 | Resp 20 | Ht 63.0 in | Wt 157.0 lb

## 2015-09-02 DIAGNOSIS — R51 Headache: Secondary | ICD-10-CM

## 2015-09-02 DIAGNOSIS — H532 Diplopia: Secondary | ICD-10-CM

## 2015-09-02 DIAGNOSIS — R519 Headache, unspecified: Secondary | ICD-10-CM

## 2015-09-02 MED ORDER — TOPIRAMATE 25 MG PO TABS: 50.0000 mg | ORAL_TABLET | Freq: Every day | ORAL | Status: DC

## 2015-09-02 NOTE — Patient Instructions (Signed)
Continue Topamax 50 mg at bedtime If your symptoms worsen or you develop new symptoms please let us know.

## 2015-09-02 NOTE — Progress Notes (Signed)
PATIENT: Natalie Hurst DOB: 1971-01-22  REASON FOR VISIT: follow up- diplopia, headache HISTORY FROM: patient  HISTORY OF PRESENT ILLNESS: Natalie Hurst is a 45 year old female with a history of congenital diplopia and headaches. She returns today for follow-up. At the last visit she was started on Topamax for her headaches. She reports that this has been working well. She states in the last couple weeks she has not had any headaches. Her headaches are typically located in the right temporal region. She denies photophobia or phonophobia. Denies nausea or vomiting. She states that she is tolerating Topamax well. She continues to have diplopia. Reports that she will have surgery July 12 to correct this. Denies any new neurological symptoms. He returns today for an evaluation.   HISTORY 05/27/15 (WILLIS): Natalie Hurst is a 45 year old right-handed white female with a history of congenital diplopia. The patient has had horizontal double vision throughout her entire life, usually worse when looking to the left, better when looking to the right. The patient has not had any functional limitations because of the double vision. She is able to operate a motor vehicle, watch TV, and read a book without too much difficulty. The patient denies any ptosis with the double vision, and no issues with chewing or swallowing. The patient has had some slight worsening of the double vision over time. She also has noted some intermittent headaches over the last 2 years, the headaches are generally in the right parietal area, occasionally with some spread of pain into the neck and shoulder area. The episodes were initially occurring once every 2 months or so, but in December 2016, the headaches converted to being daily in nature. They do not usually keep her from any activities of daily living. The patient has no photophobia or phonophobia with the headache, and no reports of nausea or vomiting. She does have some slight scalp  tenderness. The patient denies any new numbness or weakness of the face, arms, or legs. She does have a history of right carpal tunnel syndrome surgery, she has some symptoms of carpal tunnel syndrome on the left hand. The patient reports some achy sensations in the left calf at night and some occasional tingling in the left foot. The patient has had MRI evaluation of the brain that shows evidence of a cisterna magna. Otherwise, the brain was normal. The patient comes to this office for an evaluation. She has noted that over-the-counter medication such as Tylenol or Advil do not help her headache. She has some chronic mild balance issues that have been present for her entire life. She denies any issues controlling the bowels or the bladder  REVIEW OF SYSTEMS: Out of a complete 14 system review of symptoms, the patient complains only of the following symptoms, and all other reviewed systems are negative.  Double vision, restless leg, daytime sleepiness, depression, nervous/anxious  ALLERGIES: Allergies  Allergen Reactions  . Amoxicillin     Hives   . Other     Rondec- SO, heart races.     HOME MEDICATIONS: Outpatient Prescriptions Prior to Visit  Medication Sig Dispense Refill  . cetirizine (ZYRTEC) 10 MG tablet Take 10 mg by mouth daily.    Marland Kitchen. FLUoxetine (PROZAC) 20 MG capsule Take 20 mg by mouth daily.    Marland Kitchen. topiramate (TOPAMAX) 25 MG tablet Take one tablet at night for one week, then take 2 tablets at night 60 tablet 3   No facility-administered medications prior to visit.    PAST MEDICAL  HISTORY: Past Medical History  Diagnosis Date  . Anxiety   . Depression   . Diplopia 05/27/2015  . Headache 05/27/2015    PAST SURGICAL HISTORY: Past Surgical History  Procedure Laterality Date  . Right wrist surgery    . Left shoulder surgery      FAMILY HISTORY: Family History  Problem Relation Age of Onset  . Hypertension Mother   . Thyroid disease Mother   . Hypertension Father   .  Heart disease Father   . Hypertension Brother   . Thyroid disease Sister     SOCIAL HISTORY: Social History   Social History  . Marital Status: Married    Spouse Name: N/A  . Number of Children: N/A  . Years of Education: N/A   Occupational History  . Not on file.   Social History Main Topics  . Smoking status: Never Smoker   . Smokeless tobacco: Not on file  . Alcohol Use: Yes  . Drug Use: No  . Sexual Activity: Yes    Birth Control/ Protection: None   Other Topics Concern  . Not on file   Social History Narrative      PHYSICAL EXAM  Filed Vitals:   09/02/15 0734  BP: 110/68  Pulse: 76  Resp: 20  Height: 5\' 3"  (1.6 m)  Weight: 157 lb (71.215 kg)   Body mass index is 27.82 kg/(m^2).  Generalized: Well developed, in no acute distress   Neurological examination  Mentation: Alert oriented to time, place, history taking. Follows all commands speech and language fluent Cranial nerve II-XII: Pupils were equal round reactive to light. Extraocular movements were full, visual field were full on confrontational test. Facial sensation and strength were normal. Uvula tongue midline. Head turning and shoulder shrug  were normal and symmetric. Motor: The motor testing reveals 5 over 5 strength of all 4 extremities. Good symmetric motor tone is noted throughout.  Sensory: Sensory testing is intact to soft touch on all 4 extremities. No evidence of extinction is noted.  Coordination: Cerebellar testing reveals good finger-nose-finger and heel-to-shin bilaterally.  Gait and station: Gait is normal. Tandem gait is normal. Romberg is negative. No drift is seen.  Reflexes: Deep tendon reflexes are symmetric and normal bilaterally.   DIAGNOSTIC DATA (LABS, IMAGING, TESTING) - I reviewed patient records, labs, notes, testing and imaging myself where available.      ASSESSMENT AND PLAN 45 y.o. year old female  has a past medical history of Anxiety; Depression; Diplopia  (05/27/2015); and Headache (05/27/2015). here with:  1. Headache 2. Diplopia    Overall the patient is doing well. She will continue on Topamax 50 mg daily. I will refill this medication today. Patient advised that if her headache frequency or severity increases she should let us know. She will follow-up in 6 months or sooner if needed.    Butch PennyMegan Larken Urias, MSN, NP-C 09/02/2015, 7:28 AM [facility] [address] [place], [place] [address] [phone]

## 2015-09-02 NOTE — Progress Notes (Signed)
I have read the note, and I agree with the clinical assessment and plan.  Dyamon Sosinski KEITH   

## 2015-09-03 ENCOUNTER — Encounter (HOSPITAL_BASED_OUTPATIENT_CLINIC_OR_DEPARTMENT_OTHER): Payer: Self-pay | Admitting: *Deleted

## 2015-09-06 ENCOUNTER — Ambulatory Visit
Admission: RE | Admit: 2015-09-06 | Discharge: 2015-09-06 | Disposition: A | Discharge status: Home / Self Care | Payer: 59 | Source: Ambulatory Visit | Attending: Obstetrics and Gynecology | Admitting: Obstetrics and Gynecology

## 2015-09-06 DIAGNOSIS — R928 Other abnormal and inconclusive findings on diagnostic imaging of breast: Secondary | ICD-10-CM

## 2015-09-08 ENCOUNTER — Encounter (HOSPITAL_BASED_OUTPATIENT_CLINIC_OR_DEPARTMENT_OTHER): Payer: Self-pay | Admitting: *Deleted

## 2015-09-08 NOTE — Progress Notes (Signed)
NPO AFTER MN.   ARRIVE AT 0930.   NEEDS HG AND URINE PREG.  WILL TAKE PROZAC AM DOS W/ SIPS OF WATER.

## 2015-09-13 ENCOUNTER — Other Ambulatory Visit: Payer: Self-pay | Admitting: Obstetrics and Gynecology

## 2015-09-14 ENCOUNTER — Other Ambulatory Visit: Payer: Self-pay | Admitting: Obstetrics and Gynecology

## 2015-09-14 DIAGNOSIS — N6489 Other specified disorders of breast: Secondary | ICD-10-CM

## 2015-09-15 ENCOUNTER — Encounter (HOSPITAL_BASED_OUTPATIENT_CLINIC_OR_DEPARTMENT_OTHER): Payer: Self-pay | Admitting: Anesthesiology

## 2015-09-15 ENCOUNTER — Encounter (HOSPITAL_BASED_OUTPATIENT_CLINIC_OR_DEPARTMENT_OTHER)
Admission: RE | Disposition: A | Discharge status: Home / Self Care | Payer: Self-pay | Source: Ambulatory Visit | Attending: Ophthalmology

## 2015-09-15 ENCOUNTER — Ambulatory Visit (HOSPITAL_BASED_OUTPATIENT_CLINIC_OR_DEPARTMENT_OTHER)
Admission: RE | Admit: 2015-09-15 | Discharge: 2015-09-15 | Disposition: A | Discharge status: Home / Self Care | Payer: 59 | Source: Ambulatory Visit | Attending: Ophthalmology | Admitting: Ophthalmology

## 2015-09-15 ENCOUNTER — Ambulatory Visit (HOSPITAL_BASED_OUTPATIENT_CLINIC_OR_DEPARTMENT_OTHER): Payer: 59 | Admitting: Anesthesiology

## 2015-09-15 DIAGNOSIS — H532 Diplopia: Secondary | ICD-10-CM | POA: Insufficient documentation

## 2015-09-15 DIAGNOSIS — Z79899 Other long term (current) drug therapy: Secondary | ICD-10-CM | POA: Diagnosis not present

## 2015-09-15 DIAGNOSIS — F419 Anxiety disorder, unspecified: Secondary | ICD-10-CM | POA: Diagnosis not present

## 2015-09-15 DIAGNOSIS — H5000 Unspecified esotropia: Secondary | ICD-10-CM | POA: Insufficient documentation

## 2015-09-15 DIAGNOSIS — F329 Major depressive disorder, single episode, unspecified: Secondary | ICD-10-CM | POA: Diagnosis not present

## 2015-09-15 HISTORY — DX: Migraine, unspecified, not intractable, without status migrainosus: G43.909

## 2015-09-15 HISTORY — PX: MUSCLE RECESSION AND RESECTION: SHX5209

## 2015-09-15 HISTORY — PX: ADJUSTABLE SUTURE MANIPULATION: SHX5290

## 2015-09-15 HISTORY — DX: Other seasonal allergic rhinitis: J30.2

## 2015-09-15 HISTORY — DX: Unspecified esotropia: H50.00

## 2015-09-15 LAB — POCT PREGNANCY, URINE: PREG TEST UR: NEGATIVE

## 2015-09-15 LAB — POCT HEMOGLOBIN-HEMACUE: Hemoglobin: 11.7 g/dL — ABNORMAL LOW (ref 12.0–15.0)

## 2015-09-15 SURGERY — ADJUSTABLE SUTURE MANIPULATION
Laterality: Left

## 2015-09-15 SURGERY — MUSCLE RECESSION/RESECTION
Anesthesia: General | Site: Eye | Laterality: Bilateral

## 2015-09-15 MED ORDER — LIDOCAINE HCL (CARDIAC) 20 MG/ML IV SOLN
INTRAVENOUS | Status: AC
Start: 2015-09-15 — End: 2015-09-15
  Filled 2015-09-15: qty 5

## 2015-09-15 MED ORDER — ERYTHROMYCIN 5 MG/GM OP OINT
TOPICAL_OINTMENT | OPHTHALMIC | Status: DC | PRN
  Administered 2015-09-15: 1 via OPHTHALMIC

## 2015-09-15 MED ORDER — TETRACAINE HCL 0.5 % OP SOLN
OPHTHALMIC | Status: DC | PRN
  Administered 2015-09-15: 2 [drp] via OPHTHALMIC

## 2015-09-15 MED ORDER — ONDANSETRON HCL 4 MG/2ML IJ SOLN
INTRAMUSCULAR | Status: DC | PRN
  Administered 2015-09-15: 4 mg via INTRAVENOUS

## 2015-09-15 MED ORDER — PROMETHAZINE HCL 25 MG/ML IJ SOLN
6.2500 mg | INTRAMUSCULAR | Status: DC | PRN
  Filled 2015-09-15: qty 1

## 2015-09-15 MED ORDER — TOBRAMYCIN 0.3 % OP OINT: 1.0000 "application " | TOPICAL_OINTMENT | Freq: Two times a day (BID) | OPHTHALMIC | Status: DC

## 2015-09-15 MED ORDER — OXYCODONE HCL 5 MG PO TABS
ORAL_TABLET | ORAL | Status: AC
Start: 2015-09-15 — End: 2015-09-15
  Filled 2015-09-15: qty 1

## 2015-09-15 MED ORDER — KETOROLAC TROMETHAMINE 30 MG/ML IJ SOLN
INTRAMUSCULAR | Status: DC | PRN
  Administered 2015-09-15: 30 mg via INTRAVENOUS

## 2015-09-15 MED ORDER — MIDAZOLAM HCL 2 MG/2ML IJ SOLN
INTRAMUSCULAR | Status: AC
  Filled 2015-09-15: qty 2

## 2015-09-15 MED ORDER — GLYCOPYRROLATE 0.2 MG/ML IJ SOLN
INTRAMUSCULAR | Status: DC | PRN
  Administered 2015-09-15: 0.2 mg via INTRAVENOUS

## 2015-09-15 MED ORDER — FENTANYL CITRATE (PF) 100 MCG/2ML IJ SOLN
INTRAMUSCULAR | Status: DC | PRN
  Administered 2015-09-15: 25 ug via INTRAVENOUS
  Administered 2015-09-15: 50 ug via INTRAVENOUS
  Administered 2015-09-15: 25 ug via INTRAVENOUS

## 2015-09-15 MED ORDER — LIDOCAINE HCL (CARDIAC) 20 MG/ML IV SOLN
INTRAVENOUS | Status: DC | PRN
  Administered 2015-09-15: 100 mg via INTRAVENOUS

## 2015-09-15 MED ORDER — OXYCODONE HCL 5 MG PO TABS
5.0000 mg | ORAL_TABLET | Freq: Once | ORAL | Status: AC
  Administered 2015-09-15: 5 mg via ORAL
  Filled 2015-09-15: qty 1

## 2015-09-15 MED ORDER — FENTANYL CITRATE (PF) 100 MCG/2ML IJ SOLN
25.0000 ug | INTRAMUSCULAR | Status: DC | PRN
  Filled 2015-09-15: qty 1

## 2015-09-15 MED ORDER — ACETAMINOPHEN-CODEINE #2 300-15 MG PO TABS: 1.0000 | ORAL_TABLET | ORAL | Status: DC | PRN

## 2015-09-15 MED ORDER — BSS IO SOLN
INTRAOCULAR | Status: DC | PRN
  Administered 2015-09-15: 15 mL via INTRAOCULAR

## 2015-09-15 MED ORDER — LACTATED RINGERS IV SOLN
INTRAVENOUS | Status: DC
  Administered 2015-09-15: 11:00:00 via INTRAVENOUS
  Filled 2015-09-15: qty 1000

## 2015-09-15 MED ORDER — GLYCOPYRROLATE 0.2 MG/ML IJ SOLN
INTRAMUSCULAR | Status: AC
  Filled 2015-09-15: qty 1

## 2015-09-15 MED ORDER — PROPOFOL 10 MG/ML IV BOLUS
INTRAVENOUS | Status: DC | PRN
  Administered 2015-09-15: 200 mg via INTRAVENOUS

## 2015-09-15 MED ORDER — PHENYLEPHRINE HCL 2.5 % OP SOLN
OPHTHALMIC | Status: DC | PRN
  Administered 2015-09-15: 3 [drp] via OPHTHALMIC

## 2015-09-15 MED ORDER — PROPOFOL 10 MG/ML IV BOLUS
INTRAVENOUS | Status: AC
  Filled 2015-09-15: qty 40

## 2015-09-15 MED ORDER — STERILE WATER FOR IRRIGATION IR SOLN
Status: DC | PRN
  Administered 2015-09-15: 500 mL

## 2015-09-15 MED ORDER — MIDAZOLAM HCL 5 MG/5ML IJ SOLN
INTRAMUSCULAR | Status: DC | PRN
  Administered 2015-09-15: 2 mg via INTRAVENOUS

## 2015-09-15 MED ORDER — FENTANYL CITRATE (PF) 100 MCG/2ML IJ SOLN
INTRAMUSCULAR | Status: AC
  Filled 2015-09-15: qty 2

## 2015-09-15 MED ORDER — ONDANSETRON HCL 4 MG/2ML IJ SOLN
INTRAMUSCULAR | Status: AC
  Filled 2015-09-15: qty 2

## 2015-09-15 SURGICAL SUPPLY — 26 items
APL SRG 3 HI ABS STRL LF PLS (MISCELLANEOUS) ×1
APPLICATOR DR MATTHEWS STRL (MISCELLANEOUS) ×3 IMPLANT
BANDAGE EYE OVAL (MISCELLANEOUS) ×4 IMPLANT
CAUTERY EYE LOW TEMP 1300F FIN (OPHTHALMIC RELATED) ×3 IMPLANT
CLOSURE WOUND 1/2 X4 (GAUZE/BANDAGES/DRESSINGS) ×1
CORDS BIPOLAR (ELECTRODE) IMPLANT
COVER BACK TABLE 60X90IN (DRAPES) ×3 IMPLANT
COVER MAYO STAND STRL (DRAPES) ×3 IMPLANT
DRAPE LG THREE QUARTER DISP (DRAPES) ×3 IMPLANT
DRAPE SURG 17X23 STRL (DRAPES) ×9 IMPLANT
GLOVE SURG SIGNA 7.5 PF LTX (GLOVE) ×3 IMPLANT
GOWN STRL REUS W/ TWL LRG LVL3 (GOWN DISPOSABLE) ×1 IMPLANT
GOWN STRL REUS W/TWL LRG LVL3 (GOWN DISPOSABLE) ×3
KIT ROOM TURNOVER WOR (KITS) ×3 IMPLANT
PACK BASIN DAY SURGERY FS (CUSTOM PROCEDURE TRAY) ×3 IMPLANT
SPEAR EYE SURGICAL ST (MISCELLANEOUS) IMPLANT
STRIP CLOSURE SKIN 1/2X4 (GAUZE/BANDAGES/DRESSINGS) ×2 IMPLANT
SUT MERSILENE 6 0 S14 DA (SUTURE) ×4 IMPLANT
SUT VICRYL 6 0 S 29 12 (SUTURE) ×7 IMPLANT
SUT VICRYL 7 0 TG140 8 (SUTURE) IMPLANT
SUT VICRYL 8 0 TG140 8 (SUTURE) IMPLANT
TOWEL OR 17X24 6PK STRL BLUE (TOWEL DISPOSABLE) ×3 IMPLANT
TRAY DSU PREP LF (CUSTOM PROCEDURE TRAY) ×3 IMPLANT
TUBE CONNECTING 12'X1/4 (SUCTIONS)
TUBE CONNECTING 12X1/4 (SUCTIONS) IMPLANT
WATER STERILE IRR 500ML POUR (IV SOLUTION) ×2 IMPLANT

## 2015-09-15 SURGICAL SUPPLY — 12 items
APL SRG 3 HI ABS STRL LF PLS (MISCELLANEOUS) ×1
APPLICATOR DR MATTHEWS STRL (MISCELLANEOUS) ×2 IMPLANT
CONT SPECI 4OZ STER CLIK (MISCELLANEOUS) ×5 IMPLANT
COVER LIGHT HANDLE  1/PK (MISCELLANEOUS) ×2
COVER LIGHT HANDLE 1/PK (MISCELLANEOUS) IMPLANT
COVER MAYO STAND STRL (DRAPES) ×3 IMPLANT
GLOVE SURG SIGNA 7.5 PF LTX (GLOVE) ×3 IMPLANT
KIT ROOM TURNOVER WOR (KITS) ×3 IMPLANT
SPEAR EYE SURGICAL ST (MISCELLANEOUS) ×2 IMPLANT
SYR 3ML 18GX1 1/2 (SYRINGE) ×2 IMPLANT
TOWEL OR 17X24 6PK STRL BLUE (TOWEL DISPOSABLE) ×3 IMPLANT
WATER STERILE IRR 500ML POUR (IV SOLUTION) ×2 IMPLANT

## 2015-09-15 NOTE — Anesthesia Postprocedure Evaluation (Signed)
Anesthesia Post Note  Patient: Natalie Hurst  Procedure(s) Performed: Procedure(s) (LRB): RIGHT MEDIAL RECTUS POSTERIOR FIXATION SUTURE,LEFT LATERAL RECTUS RESECTION WITH ADJUSTABLE SUTURE  (Bilateral)  Patient location during evaluation: PACU Anesthesia Type: General Level of consciousness: awake and alert Pain management: pain level controlled Vital Signs Assessment: post-procedure vital signs reviewed and stable Respiratory status: spontaneous breathing, nonlabored ventilation, respiratory function stable and patient connected to nasal cannula oxygen Cardiovascular status: blood pressure returned to baseline and stable Postop Assessment: no signs of nausea or vomiting Anesthetic complications: no    Last Vitals:  Filed Vitals:   09/15/15 1335 09/15/15 1340  BP:    Pulse: 66 78  Temp:    Resp: 11 10    Last Pain:  Filed Vitals:   09/15/15 1444  PainSc: 2                  Phillips Groutarignan, Clair Bardwell

## 2015-09-15 NOTE — Anesthesia Preprocedure Evaluation (Addendum)
Anesthesia Evaluation  Patient identified by MRN, date of birth, ID band Patient awake    Reviewed: Allergy & Precautions, H&P , NPO status , Patient's Chart, lab work & pertinent test results  History of Anesthesia Complications Negative for: history of anesthetic complications  Airway Mallampati: II  TM Distance: >3 FB Neck ROM: full    Dental no notable dental hx.    Pulmonary neg pulmonary ROS,    Pulmonary exam normal breath sounds clear to auscultation       Cardiovascular negative cardio ROS Normal cardiovascular exam Rhythm:regular Rate:Normal     Neuro/Psych  Headaches, Anxiety Depression    GI/Hepatic negative GI ROS, Neg liver ROS,   Endo/Other  negative endocrine ROS  Renal/GU negative Renal ROS     Musculoskeletal   Abdominal   Peds  Hematology negative hematology ROS (+)   Anesthesia Other Findings   Reproductive/Obstetrics negative OB ROS                             Anesthesia Physical Anesthesia Plan  ASA: II  Anesthesia Plan: General   Post-op Pain Management:    Induction: Intravenous  Airway Management Planned: LMA  Additional Equipment:   Intra-op Plan:   Post-operative Plan: Extubation in OR  Informed Consent: I have reviewed the patients History and Physical, chart, labs and discussed the procedure including the risks, benefits and alternatives for the proposed anesthesia with the patient or authorized representative who has indicated his/her understanding and acceptance.   Dental Advisory Given  Plan Discussed with: Anesthesiologist, CRNA and Surgeon  Anesthesia Plan Comments: (Would avoid decadron given allergy, glycopyrrolate ppx intraop to avoid oculocardiac reflex, zofran and propofol based anesthesia to decrease risk of PONV)      Anesthesia Quick Evaluation

## 2015-09-15 NOTE — Interval H&P Note (Signed)
History and Physical Interval Note:  09/15/2015 11:43 AM  Natalie OatsAngela F Roussin  has presented today for surgery, with the diagnosis of ESOTROPIA DIPLOPIA   The various methods of treatment have been discussed with the patient and family. After consideration of risks, benefits and other options for treatment, the patient has consented to  Procedure(s): RIGHT MEDIAL RECTUS POSTERIOR FIXATION SUTURE,LEFT LATERAL RECTUS RESECTION WITH ADJUSTABLE SUTURE  (Bilateral) as a surgical intervention .  The patient's history has been reviewed, patient examined, no change in status, stable for surgery.  I have reviewed the patient's chart and labs.  Questions were answered to the patient's satisfaction.     Conception Doebler A

## 2015-09-15 NOTE — Discharge Instructions (Addendum)

## 2015-09-15 NOTE — Brief Op Note (Signed)
09/15/2015  1:05 PM  PATIENT:  Dewaine OatsAngela F Thresher  45 y.o. female  PRE-OPERATIVE DIAGNOSIS:  ESOTROPIA DIPLOPIA   POST-OPERATIVE DIAGNOSIS:  ESOTROPIA DIPLOPIA   PROCEDURE:  Procedure(s): RIGHT MEDIAL RECTUS POSTERIOR FIXATION SUTURE,LEFT LATERAL RECTUS RESECTION WITH ADJUSTABLE SUTURE  (Bilateral)  SURGEON:  Surgeon(s) and Role:    * Aura CampsMichael Aulani Shipton, MD - Primary  PHYSICIAN ASSISTANT:   ASSISTANTS: none   ANESTHESIA:   none  EBL:  Total I/O In: 500 [I.V.:500] Out: 5 [Blood:5]  BLOOD ADMINISTERED:none  DRAINS: none   LOCAL MEDICATIONS USED:  NONE  SPECIMEN:  No Specimen  DISPOSITION OF SPECIMEN:  N/A  COUNTS:  YES  TOURNIQUET:  * No tourniquets in log *  DICTATION: .Other Dictation: Dictation Number [id number]  PLAN OF CARE: Discharge to home after PACU  PATIENT DISPOSITION:  PACU - hemodynamically stable.   Delay start of Pharmacological VTE agent (>24hrs) due to surgical blood loss or risk of bleeding: no

## 2015-09-15 NOTE — Transfer of Care (Signed)
   Last Vitals:  Filed Vitals:   09/15/15 0957  BP: 101/54  Pulse: 74  Temp: 37 C  Resp: 12    Last Pain: There were no vitals filed for this visit.    Patients Stated Pain Goal: 6 (09/15/15 1013)  Immediate Anesthesia Transfer of Care Note  Patient: Natalie Hurst  Procedure(s) Performed: Procedure(s) (LRB): RIGHT MEDIAL RECTUS POSTERIOR FIXATION SUTURE,LEFT LATERAL RECTUS RESECTION WITH ADJUSTABLE SUTURE  (Bilateral)  Patient Location: PACU  Anesthesia Type: General  Level of Consciousness: awake, alert  and oriented  Airway & Oxygen Therapy: Patient Spontanous Breathing and Patient connected to nasal cannula oxygen  Post-op Assessment: Report given to PACU RN and Post -op Vital signs reviewed and stable  Post vital signs: Reviewed and stable  Complications: No apparent anesthesia complications

## 2015-09-15 NOTE — Interval H&P Note (Signed)
History and Physical Interval Note:  09/15/2015 11:44 AM  Natalie Hurst  has presented today for surgery, with the diagnosis of ESOTROPIA DIPLOPIA   The various methods of treatment have been discussed with the patient and family. After consideration of risks, benefits and other options for treatment, the patient has consented to  Procedure(s): RIGHT MEDIAL RECTUS POSTERIOR FIXATION SUTURE,LEFT LATERAL RECTUS RESECTION WITH ADJUSTABLE SUTURE  (Bilateral) as a surgical intervention .  The patient's history has been reviewed, patient examined, no change in status, stable for surgery.  I have reviewed the patient's chart and labs.  Questions were answered to the patient's satisfaction.     Rilda Bulls A

## 2015-09-15 NOTE — Anesthesia Procedure Notes (Signed)
Procedure Name: LMA Insertion Date/Time: 09/15/2015 12:50 PM Performed by: Norva PavlovALLAWAY, Ileah Falkenstein G Pre-anesthesia Checklist: Patient identified, Emergency Drugs available, Suction available and Patient being monitored Patient Re-evaluated:Patient Re-evaluated prior to inductionOxygen Delivery Method: Circle system utilized Preoxygenation: Pre-oxygenation with 100% oxygen Intubation Type: IV induction Ventilation: Mask ventilation without difficulty LMA: LMA flexible inserted LMA Size: 4.0 Number of attempts: 1 Airway Equipment and Method: Bite block Placement Confirmation: positive ETCO2 Tube secured with: Tape Dental Injury: Teeth and Oropharynx as per pre-operative assessment

## 2015-09-16 NOTE — Op Note (Signed)
NAMMerton Hurst:  Hurst, Natalie               ACCOUNT NO.:  [id number]  MEDICAL RECORD NO.:  [id number]  LOCATION:                                 FACILITY:  PHYSICIAN:  Natalie Hurst, Natalie HurstDATE OF BIRTH:  Dec 07, 1970  DATE OF PROCEDURE:  09/15/2015 DATE OF DISCHARGE:                              OPERATIVE REPORT   PREOPERATIVE DIAGNOSIS:  Diplopia with incomitant esotropia in left gaze.  PROCEDURE:  Right medial rectus posterior fixation suture, a left lateral rectus resection via plication with adjustable suture.  SURGEON:  Natalie Hurst, Natalie Hurst.  ANESTHESIA:  General with laryngeal mask airway.  INDICATION FOR PROCEDURE:  Natalie Hurst is a 45 year old female with incomitant strabismus and diplopia.  This procedure is indicated to restore single binocular vision and restore alignment of visual axis in all directions of gaze.  The risks and benefits of the procedure explained to the patient prior to the procedure an informed consent was obtained.  DESCRIPTION OF TECHNIQUE:  The patient was taken in to the operating room and placed in a supine position.  The entire face was prepped and draped in the usual sterile fashion after induction via general anesthesia and establishment of laryngeal mask airway.  My attention was first directed to the right eye.  A lid speculum was placed.  Forced duction tests were performed and found to be negative.  The globe was then held in the inferior nasal quadrant.  The eye was elevated and abducted, and incision was made through the inferior nasal fornix, taken down to the posterior sub-Tenons space, and the right medial rectus tendon was then isolated on a Stevens hook, subsequently on a Green hook. The tendon was then carefully dissected free from its overlying muscle fascia and intermuscular septae were transected for a distance of approximately 14 mm.  A mark was then placed on the tendon at 12 mm from its native insertion.  A 6-0 Mersilene  suture was then used to incorporate the nasal 1/3 of the tendon at the pre-placed mark at 12 mm from its insertion.  This suture was also used to incorporate the posterior fixation pulley system at the medial aspect of the tendon. Next, a second 6-0 Mersilene suture was placed at the temporal 1/3 of the tendon and at the 12 mm mark, and this suture was also used to incorporate the posterior fixation pulley system at the temporal aspect of the tendon.  Both sutures were tied securely and the conjunctiva was then repositioned.  Next, my attention was directed to the left eye for the left lateral rectus resection via plication.  The lid speculum was placed.  The globe was held in the inferior temporal quadrant and the eye was elevated and adducted.  An incision was made through the inferior temporal fornix and taken down to the posterior sub-Tenons space and the left lateral rectus tendon was isolated on the Stevens hook and Subsequently on a  Green hook.  A second Green hook was then passed beneath the tendon and this was used to hold the globe in an elevated and adducted position.  Next, the tendon was dissected free from its overlying muscle fascia and intermuscular septae were  transected to expose the tendon for a distance of approximately 10 mm. A Mark was then placed on the tendon at 7 mm from its native insertion.  The tendon was then imbricated on 6-0 Vicryl suture taking 2 locking bites at the medial and temporal apices at the pre-placed marks.  The tendon was then plicated, reattaching the tendon with the pre-placed sutures at its native insertion site.  In an adjustable suture fashion, the sutures tied for future adjustment and the eye was checked for good position and alignment.  The conjunctiva was repositioned and the instruments were removed.  The patient was subsequently transferred to the recovery room and recovered for approximately 20 minutes, subsequently returned to the  operative suite and adjusted to orthophoria with the pre-placed sutures.  The sutures were then cut short and tied permanently and the conjunctiva was repositioned.  There were no apparent complications.     Natalie Hurst, Natalie Hurst.   ______________________________ Natalie Hurst, Natalie Hurst.    MAS/MEDQ  D:  09/15/2015  T:  09/16/2015  Job:  [id number]

## 2015-09-17 ENCOUNTER — Encounter (HOSPITAL_BASED_OUTPATIENT_CLINIC_OR_DEPARTMENT_OTHER): Payer: Self-pay | Admitting: Ophthalmology

## 2015-10-06 ENCOUNTER — Ambulatory Visit
Admission: RE | Admit: 2015-10-06 | Discharge: 2015-10-06 | Disposition: A | Discharge status: Home / Self Care | Payer: 59 | Source: Ambulatory Visit | Attending: Obstetrics and Gynecology | Admitting: Obstetrics and Gynecology

## 2015-10-06 DIAGNOSIS — N6489 Other specified disorders of breast: Secondary | ICD-10-CM

## 2015-10-06 MED ORDER — GADOBENATE DIMEGLUMINE 529 MG/ML IV SOLN
14.0000 mL | Freq: Once | INTRAVENOUS | Status: AC | PRN
  Administered 2015-10-06: 14 mL via INTRAVENOUS

## 2016-03-09 ENCOUNTER — Encounter: Payer: Self-pay | Admitting: Adult Health

## 2016-03-09 ENCOUNTER — Ambulatory Visit (INDEPENDENT_AMBULATORY_CARE_PROVIDER_SITE_OTHER): Payer: 59 | Admitting: Adult Health

## 2016-03-09 VITALS — BP 94/55 | HR 82 | Resp 20 | Ht 63.0 in | Wt 146.0 lb

## 2016-03-09 DIAGNOSIS — G43009 Migraine without aura, not intractable, without status migrainosus: Secondary | ICD-10-CM

## 2016-03-09 NOTE — Patient Instructions (Addendum)
Continue Topamax Follow up with Natalie Hurst in regards to worsening depression If your symptoms worsen or you develop new symptoms please let us know.

## 2016-03-09 NOTE — Progress Notes (Signed)
I have read the note, and I agree with the clinical assessment and plan.  Torra Pala KEITH   

## 2016-03-09 NOTE — Progress Notes (Signed)
PATIENT: Natalie Hurst DOB: 15-Oct-1970  REASON FOR VISIT: follow up- headache HISTORY FROM: patient  HISTORY OF PRESENT ILLNESS: Today 03/09/2016: [name] is a 46 year old female with a history of headaches. She returns today for follow-up. She reports that her headaches have improved on Topamax. She continues taking Topamax 50 mg twice a day. She reports approximately 2-3 times a week she will have a dull pain in the right side of her head but the duration is short. She denies photophobia, phonophobia, nausea and vomiting. She reports her headaches are tolerable at this point. She states that her entire life she has struggled with depression and anxiety. She is currently on Prozac that is being managed by Dr. Hyacinth MeekerMiller. She reports that she is at a low time right now and feels that her Prozac may need to be adjusted. She returns today for an evaluation.  HISTORY 09/02/15: [name] is a 46 year old female with a history of congenital diplopia and headaches. She returns today for follow-up. At the last visit she was started on Topamax for her headaches. She reports that this has been working well. She states in the last couple weeks she has not had any headaches. Her headaches are typically located in the right temporal region. She denies photophobia or phonophobia. Denies nausea or vomiting. She states that she is tolerating Topamax well. She continues to have diplopia. Reports that she will have surgery July 12 to correct this. Denies any new neurological symptoms. He returns today for an evaluation.   HISTORY 05/27/15 (WILLIS): [name] is a 46 year old right-handed white female with a history of congenital diplopia. The patient has had horizontal double vision throughout her entire life, usually worse when looking to the left, better when looking to the right. The patient has not had any functional limitations because of the double vision. She is able to operate a motor vehicle, watch TV,  and read a book without too much difficulty. The patient denies any ptosis with the double vision, and no issues with chewing or swallowing. The patient has had some slight worsening of the double vision over time. She also has noted some intermittent headaches over the last 2 years, the headaches are generally in the right parietal area, occasionally with some spread of pain into the neck and shoulder area. The episodes were initially occurring once every 2 months or so, but in December 2016, the headaches converted to being daily in nature. They do not usually keep her from any activities of daily living. The patient has no photophobia or phonophobia with the headache, and no reports of nausea or vomiting. She does have some slight scalp tenderness. The patient denies any new numbness or weakness of the face, arms, or legs. She does have a history of right carpal tunnel syndrome surgery, she has some symptoms of carpal tunnel syndrome on the left hand. The patient reports some achy sensations in the left calf at night and some occasional tingling in the left foot. The patient has had MRI evaluation of the brain that shows evidence of a cisterna magna. Otherwise, the brain was normal. The patient comes to this office for an evaluation. She has noted that over-the-counter medication such as Tylenol or Advil do not help her headache. She has some chronic mild balance issues that have been present for her entire life. She denies any issues controlling the bowels or the bladder  REVIEW OF SYSTEMS: Out of a complete 14 system review of symptoms, the patient  complains only of the following symptoms, and all other reviewed systems are negative.  Double vision, constipation, diarrhea, restless leg, depression, nervous or anxious  ALLERGIES: Allergies  Allergen Reactions  . Decadron [Dexamethasone] Shortness Of Breath and Palpitations    "RONDEC"  . Amoxicillin Hives    HOME MEDICATIONS: Outpatient  Medications Prior to Visit  Medication Sig Dispense Refill  . cetirizine (ZYRTEC) 10 MG tablet Take 10 mg by mouth at bedtime.     Marland Kitchen FLUoxetine (PROZAC) 20 MG capsule Take 20 mg by mouth every morning.     Marland Kitchen ibuprofen (ADVIL,MOTRIN) 200 MG tablet Take 800 mg by mouth every 6 (six) hours as needed.    . topiramate (TOPAMAX) 25 MG tablet Take 2 tablets (50 mg total) by mouth daily. (Patient taking differently: Take 50 mg by mouth at bedtime. ) 60 tablet 11  . acetaminophen-codeine (TYLENOL #2) 300-15 MG tablet Take 1 tablet by mouth every 4 (four) hours as needed for moderate pain. 30 tablet 0  . tobramycin (TOBREX) 0.3 % ophthalmic ointment Place 1 application into both eyes 2 (two) times daily. 3.5 g 0   No facility-administered medications prior to visit.     PAST MEDICAL HISTORY: Past Medical History:  Diagnosis Date  . Anxiety   . Depression   . Diplopia    bilateral  . Esotropia, congenital    bilateral persistant incommitant  . Migraine   . Seasonal allergies     PAST SURGICAL HISTORY: Past Surgical History:  Procedure Laterality Date  . ADJUSTABLE SUTURE MANIPULATION Left 09/15/2015   Procedure: LEFT EYE ADJUSTABLE SUTURE MANIPULATION;  Surgeon: Aura Camps, MD;  Location: [facility];  Service: Ophthalmology;  Laterality: Left;  Marland Kitchen MUSCLE RECESSION AND RESECTION Bilateral 09/15/2015   Procedure: RIGHT MEDIAL RECTUS POSTERIOR FIXATION SUTURE,LEFT LATERAL RECTUS RESECTION WITH ADJUSTABLE SUTURE ;  Surgeon: Aura Camps, MD;  [facility];  Service: Ophthalmology;  Laterality: Bilateral;  . ORIF RIGHT WRIST FX  01/ 2010   and Carpal Tunnel Release  . SHOULDER ARTHROSCOPY W/ LABRAL REPAIR Left 09-10-2007   and Debridement  . WISDOM TOOTH EXTRACTION  1989    FAMILY HISTORY: Family History  Problem Relation Age of Onset  . Hypertension Mother   . Thyroid disease Mother   . Hypertension Father   . Heart disease Father   .  Hypertension Brother   . Thyroid disease Sister     SOCIAL HISTORY: Social History   Social History  . Marital status: Married    Spouse name: N/A  . Number of children: N/A  . Years of education: N/A   Occupational History  . Not on file.   Social History Main Topics  . Smoking status: Never Smoker  . Smokeless tobacco: Never Used  . Alcohol use 2.4 oz/week    4 Glasses of wine per week     Comment: OCCASIONAL  . Drug use: No  . Sexual activity: Yes    Birth control/ protection: None   Other Topics Concern  . Not on file   Social History Narrative  . No narrative on file      PHYSICAL EXAM  Vitals:   03/09/16 0740  BP: (!) 94/55  Pulse: 82  Resp: 20  Weight: 146 lb (66.2 kg)  Height: 5\' 3"  (1.6 m)   Body mass index is 25.86 kg/m.  Generalized: Well developed, in no acute distress   Neurological examination  Mentation: Alert oriented to time, place, history taking.  Follows all commands speech and language fluent Cranial nerve II-XII: Pupils were equal round reactive to light. Extraocular movements were full, visual field were full on confrontational test. Facial sensation and strength were normal. Uvula tongue midline. Head turning and shoulder shrug  were normal and symmetric. Motor: The motor testing reveals 5 over 5 strength of all 4 extremities. Good symmetric motor tone is noted throughout.  Sensory: Sensory testing is intact to soft touch on all 4 extremities. No evidence of extinction is noted.  Coordination: Cerebellar testing reveals good finger-nose-finger and heel-to-shin bilaterally.  Gait and station: Gait is normal. Tandem gait is normal. Romberg is negative. No drift is seen.  Reflexes: Deep tendon reflexes are symmetric and normal bilaterally.   DIAGNOSTIC DATA (LABS, IMAGING, TESTING) - I reviewed patient records, labs, notes, testing and imaging myself where available.   Lab Results  Component Value Date   VITAMINB12 309 05/27/2015    Lab Results  Component Value Date   TSH 2.290 05/27/2015      ASSESSMENT AND PLAN 46 y.o. year old female  has a past medical history of Anxiety; Depression; Diplopia; Esotropia, congenital; Migraine; and Seasonal allergies. here with:  1. Migraine headaches  Patient will continue on Topamax 50 mg at bedtime. I did advise that antiepileptic medication can sometimes worsen depression. Patient verbalized understanding. However she reports that this has been ongoing and does not feel that Topamax has had any effect on her depression. She is encouraged to follow-up with Dr. Hyacinth Meeker. She will follow-up with our office in 6 months or sooner if needed.     Butch Penny, MSN, NP-C 03/09/2016, 7:58 AM [facility] [address] [place], [place] [address] [phone]

## 2016-05-02 DIAGNOSIS — H5000 Unspecified esotropia: Secondary | ICD-10-CM | POA: Diagnosis not present

## 2016-05-02 DIAGNOSIS — H532 Diplopia: Secondary | ICD-10-CM | POA: Diagnosis not present

## 2016-05-02 DIAGNOSIS — H538 Other visual disturbances: Secondary | ICD-10-CM | POA: Diagnosis not present

## 2016-05-03 ENCOUNTER — Other Ambulatory Visit: Payer: Self-pay | Admitting: Surgery

## 2016-05-03 DIAGNOSIS — N6453 Retraction of nipple: Secondary | ICD-10-CM

## 2016-05-03 DIAGNOSIS — N63 Unspecified lump in unspecified breast: Secondary | ICD-10-CM

## 2016-05-05 ENCOUNTER — Ambulatory Visit
Admission: RE | Admit: 2016-05-05 | Discharge: 2016-05-05 | Disposition: A | Discharge status: Home / Self Care | Payer: 59 | Source: Ambulatory Visit | Attending: Surgery | Admitting: Surgery

## 2016-05-05 ENCOUNTER — Other Ambulatory Visit: Payer: Self-pay | Admitting: Surgery

## 2016-05-05 DIAGNOSIS — N6453 Retraction of nipple: Secondary | ICD-10-CM

## 2016-05-05 DIAGNOSIS — R922 Inconclusive mammogram: Secondary | ICD-10-CM | POA: Diagnosis not present

## 2016-05-05 DIAGNOSIS — N63 Unspecified lump in unspecified breast: Secondary | ICD-10-CM

## 2016-05-05 DIAGNOSIS — N6489 Other specified disorders of breast: Secondary | ICD-10-CM | POA: Diagnosis not present

## 2016-05-09 DIAGNOSIS — H5213 Myopia, bilateral: Secondary | ICD-10-CM | POA: Diagnosis not present

## 2016-05-09 DIAGNOSIS — H532 Diplopia: Secondary | ICD-10-CM | POA: Diagnosis not present

## 2016-05-09 DIAGNOSIS — H524 Presbyopia: Secondary | ICD-10-CM | POA: Diagnosis not present

## 2016-05-16 DIAGNOSIS — H5000 Unspecified esotropia: Secondary | ICD-10-CM | POA: Diagnosis not present

## 2016-05-16 DIAGNOSIS — H538 Other visual disturbances: Secondary | ICD-10-CM | POA: Diagnosis not present

## 2016-05-16 DIAGNOSIS — H532 Diplopia: Secondary | ICD-10-CM | POA: Diagnosis not present

## 2016-08-28 ENCOUNTER — Other Ambulatory Visit: Payer: Self-pay | Admitting: Obstetrics and Gynecology

## 2016-08-29 DIAGNOSIS — N926 Irregular menstruation, unspecified: Secondary | ICD-10-CM | POA: Diagnosis not present

## 2016-08-29 DIAGNOSIS — Z1231 Encounter for screening mammogram for malignant neoplasm of breast: Secondary | ICD-10-CM | POA: Diagnosis not present

## 2016-08-29 DIAGNOSIS — Z124 Encounter for screening for malignant neoplasm of cervix: Secondary | ICD-10-CM | POA: Diagnosis not present

## 2016-08-29 DIAGNOSIS — Z01411 Encounter for gynecological examination (general) (routine) with abnormal findings: Secondary | ICD-10-CM | POA: Diagnosis not present

## 2016-09-02 ENCOUNTER — Other Ambulatory Visit: Payer: Self-pay | Admitting: Adult Health

## 2016-09-07 ENCOUNTER — Ambulatory Visit: Payer: 59 | Admitting: Adult Health

## 2016-09-11 DIAGNOSIS — N921 Excessive and frequent menstruation with irregular cycle: Secondary | ICD-10-CM | POA: Diagnosis not present

## 2016-09-11 DIAGNOSIS — N926 Irregular menstruation, unspecified: Secondary | ICD-10-CM | POA: Diagnosis not present

## 2016-09-22 DIAGNOSIS — T148XXA Other injury of unspecified body region, initial encounter: Secondary | ICD-10-CM | POA: Diagnosis not present

## 2016-09-22 DIAGNOSIS — Z0000 Encounter for general adult medical examination without abnormal findings: Secondary | ICD-10-CM | POA: Diagnosis not present

## 2016-09-22 DIAGNOSIS — R51 Headache: Secondary | ICD-10-CM | POA: Diagnosis not present

## 2016-09-22 DIAGNOSIS — L659 Nonscarring hair loss, unspecified: Secondary | ICD-10-CM | POA: Diagnosis not present

## 2016-09-25 ENCOUNTER — Encounter: Payer: Self-pay | Admitting: Adult Health

## 2016-09-25 ENCOUNTER — Ambulatory Visit (INDEPENDENT_AMBULATORY_CARE_PROVIDER_SITE_OTHER): Payer: 59 | Admitting: Adult Health

## 2016-09-25 VITALS — BP 86/60 | HR 72 | Wt 142.6 lb

## 2016-09-25 DIAGNOSIS — F32A Depression, unspecified: Secondary | ICD-10-CM

## 2016-09-25 DIAGNOSIS — F329 Major depressive disorder, single episode, unspecified: Secondary | ICD-10-CM

## 2016-09-25 DIAGNOSIS — G43009 Migraine without aura, not intractable, without status migrainosus: Secondary | ICD-10-CM

## 2016-09-25 NOTE — Progress Notes (Signed)
I have read the note, and I agree with the clinical assessment and plan.  Hillary Schwegler KEITH   

## 2016-09-25 NOTE — Progress Notes (Signed)
PATIENT: Natalie Hurst DOB: 1970/12/26  REASON FOR VISIT: follow up- Headache HISTORY FROM: patient  HISTORY OF PRESENT ILLNESS: Today 09/25/16 [name] is a 46 year old female with a history of headaches. She returns today for follow-up. She states that her headaches have been under relatively good control. She states that she has not had a severe headache in quite some time. Occasionally she'll have a mild headache that occurs on the right side but is very short in duration. She denies photophobia and phonophobia. Denies nausea and vomiting she remains on Topamax 50 mg twice a day. She reports that she is tolerating this well. She continues to see Dr. Hyacinth Meeker for depression. She reports that she saw her last Friday and her Prozac was increased to 40 mg. She reports at this time her depression is better but "not great." She returns today for follow-up.   HISTORY  03/09/2016: [name] is a 46 year old female with a history of headaches. She returns today for follow-up. She reports that her headaches have improved on Topamax. She continues taking Topamax 50 mg twice a day. She reports approximately 2-3 times a week she will have a dull pain in the right side of her head but the duration is short. She denies photophobia, phonophobia, nausea and vomiting. She reports her headaches are tolerable at this point. She states that her entire life she has struggled with depression and anxiety. She is currently on Prozac that is being managed by Dr. Hyacinth Meeker. She reports that she is at a low time right now and feels that her Prozac may need to be adjusted. She returns today for an evaluation.  HISTORY 09/02/15: [name] is a 46 year old female with a history of congenital diplopia and headaches. She returns today for follow-up. At the last visit she was started on Topamax for her headaches. She reports that this has been working well. She states in the last couple weeks she has not had any headaches. Her  headaches are typically located in the right temporal region. She denies photophobia or phonophobia. Denies nausea or vomiting. She states that she is tolerating Topamax well. She continues to have diplopia. Reports that she will have surgery July 12 to correct this. Denies any new neurological symptoms. He returns today for an evaluation.  REVIEW OF SYSTEMS: Out of a complete 14 system review of symptoms, the patient complains only of the following symptoms, and all other reviewed systems are negative.  Depression, anxious  ALLERGIES: Allergies  Allergen Reactions  . Decadron [Dexamethasone] Shortness Of Breath and Palpitations    "RONDEC"  . Amoxicillin Hives    penicillin    HOME MEDICATIONS: Outpatient Medications Prior to Visit  Medication Sig Dispense Refill  . cetirizine (ZYRTEC) 10 MG tablet Take 10 mg by mouth at bedtime.     Marland Kitchen ibuprofen (ADVIL,MOTRIN) 200 MG tablet Take 800 mg by mouth every 6 (six) hours as needed.    . topiramate (TOPAMAX) 25 MG tablet take 2 tablets by mouth once daily 60 tablet 11  . FLUoxetine (PROZAC) 20 MG capsule Take 20 mg by mouth every morning.      No facility-administered medications prior to visit.     PAST MEDICAL HISTORY: Past Medical History:  Diagnosis Date  . Anxiety   . Depression   . Diplopia    bilateral  . Esotropia, congenital    bilateral persistant incommitant  . Migraine   . Seasonal allergies     PAST SURGICAL HISTORY: Past Surgical  History:  Procedure Laterality Date  . ADJUSTABLE SUTURE MANIPULATION Left 09/15/2015   Procedure: LEFT EYE ADJUSTABLE SUTURE MANIPULATION;  Surgeon: Aura CampsMichael Spencer, MD;  [facility];  Service: Ophthalmology;  Laterality: Left;  Marland Kitchen. MUSCLE RECESSION AND RESECTION Bilateral 09/15/2015   Procedure: RIGHT MEDIAL RECTUS POSTERIOR FIXATION SUTURE,LEFT LATERAL RECTUS RESECTION WITH ADJUSTABLE SUTURE ;  Surgeon: Aura CampsMichael Spencer, MD;  [facility];   Service: Ophthalmology;  Laterality: Bilateral;  . ORIF RIGHT WRIST FX  01/ 2010   and Carpal Tunnel Release  . SHOULDER ARTHROSCOPY W/ LABRAL REPAIR Left 09-10-2007   and Debridement  . WISDOM TOOTH EXTRACTION  1989    FAMILY HISTORY: Family History  Problem Relation Age of Onset  . Hypertension Mother   . Thyroid disease Mother   . Hypertension Father   . Heart disease Father   . Hypertension Brother   . Thyroid disease Sister     SOCIAL HISTORY: Social History   Social History  . Marital status: Married    Spouse name: N/A  . Number of children: N/A  . Years of education: N/A   Occupational History  . Not on file.   Social History Main Topics  . Smoking status: Never Smoker  . Smokeless tobacco: Never Used  . Alcohol use 2.4 oz/week    4 Glasses of wine per week     Comment: OCCASIONAL  . Drug use: No  . Sexual activity: Yes    Birth control/ protection: None   Other Topics Concern  . Not on file   Social History Narrative  . No narrative on file      PHYSICAL EXAM  Vitals:   09/25/16 0756  BP: (!) 86/60  Pulse: 72  Weight: 142 lb 9.6 oz (64.7 kg)   Body mass index is 25.26 kg/m.  Generalized: Well developed, in no acute distress   Neurological examination  Mentation: Alert oriented to time, place, history taking. Follows all commands speech and language fluent Cranial nerve II-XII: Pupils were equal round reactive to light. Extraocular movements were full, visual field were full on confrontational test. Facial sensation and strength were normal. Uvula tongue midline. Head turning and shoulder shrug  were normal and symmetric. Motor: The motor testing reveals 5 over 5 strength of all 4 extremities. Good symmetric motor tone is noted throughout.  Sensory: Sensory testing is intact to soft touch on all 4 extremities. No evidence of extinction is noted.  Coordination: Cerebellar testing reveals good finger-nose-finger and heel-to-shin bilaterally.    Gait and station: Gait is normal.  Reflexes: Deep tendon reflexes are symmetric and normal bilaterally.   DIAGNOSTIC DATA (LABS, IMAGING, TESTING) - I reviewed patient records, labs, notes, testing and imaging myself where available.    Lab Results  Component Value Date   VITAMINB12 309 05/27/2015   Lab Results  Component Value Date   TSH 2.290 05/27/2015      ASSESSMENT AND PLAN 46 y.o. year old female  has a past medical history of Anxiety; Depression; Diplopia; Esotropia, congenital; Migraine; and Seasonal allergies. here with:  1. Migraine headache 2. Depression  Overall the patient is doing well. She will continue on Topamax 50 mg twice a day. She is on Prozac for depression. This is followed by Dr. Hyacinth MeekerMiller. She is advised that if her symptoms worsen or she develops new symptoms she should let us know. She will follow-up in 6 months or sooner if needed.     Butch PennyMegan Dayton Sherr, MSN,  NP-C 09/25/2016, 8:04 AM [facility] [address], [address] [place], [place] [address] [phone]

## 2016-09-25 NOTE — Patient Instructions (Addendum)
Your Plan:  Continue Topamax 50 mg twice a day If your symptoms worsen or you develop new symptoms please let us know.   Thank you for coming to see us at [facility]. I hope we have been able to provide you high quality care today.  You may receive a patient satisfaction survey over the next few weeks. We would appreciate your feedback and comments so that we may continue to improve ourselves and the health of our patients.  
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
## 2016-12-04 DIAGNOSIS — Z23 Encounter for immunization: Secondary | ICD-10-CM | POA: Diagnosis not present

## 2017-05-15 DIAGNOSIS — R51 Headache: Secondary | ICD-10-CM | POA: Diagnosis not present

## 2017-05-15 DIAGNOSIS — H532 Diplopia: Secondary | ICD-10-CM | POA: Diagnosis not present

## 2017-08-30 DIAGNOSIS — Z124 Encounter for screening for malignant neoplasm of cervix: Secondary | ICD-10-CM | POA: Diagnosis not present

## 2017-08-30 DIAGNOSIS — Z01419 Encounter for gynecological examination (general) (routine) without abnormal findings: Secondary | ICD-10-CM | POA: Diagnosis not present

## 2017-08-30 DIAGNOSIS — Z6835 Body mass index (BMI) 35.0-35.9, adult: Secondary | ICD-10-CM | POA: Diagnosis not present

## 2017-09-16 ENCOUNTER — Other Ambulatory Visit: Payer: Self-pay | Admitting: Neurology

## 2017-09-26 ENCOUNTER — Ambulatory Visit: Payer: 59 | Admitting: Adult Health

## 2017-09-26 ENCOUNTER — Encounter: Payer: Self-pay | Admitting: Adult Health

## 2017-09-26 VITALS — BP 89/55 | HR 64 | Wt 149.4 lb

## 2017-09-26 DIAGNOSIS — G43009 Migraine without aura, not intractable, without status migrainosus: Secondary | ICD-10-CM

## 2017-09-26 MED ORDER — TOPIRAMATE 25 MG PO TABS: 50.0000 mg | ORAL_TABLET | Freq: Every day | ORAL | 3 refills | Status: DC

## 2017-09-26 NOTE — Progress Notes (Signed)
PATIENT: Natalie Hurst DOB: 21-Aug-1970  REASON FOR VISIT: follow up HISTORY FROM: patient  HISTORY OF PRESENT ILLNESS: Today 09/26/17:  Natalie Hurst is a 47 year old female with a history of headaches.  She returns today for follow-up.  She states that her headaches continue to be under good control.  She takes 50 mg at bedtime.  She states that her headaches are very rare.  To the point that she does not know the frequency of her headaches.  She states that when she does get a headache is typically dull and located on the right side of the head.  She typically does not have any associated symptoms.  She denies any new symptoms.  She returns today for evaluation.  HISTORY 09/25/16 Natalie Hurst is a 47 year old female with a history of headaches. She returns today for follow-up. She states that her headaches have been under relatively good control. She states that she has not had a severe headache in quite some time. Occasionally she'll have a mild headache that occurs on the right side but is very short in duration. She denies photophobia and phonophobia. Denies nausea and vomiting she remains on Topamax 50 mg twice a day. She reports that she is tolerating this well. She continues to see Dr. Hyacinth Meeker for depression. She reports that she saw her last Friday and her Prozac was increased to 40 mg. She reports at this time her depression is better but "not great." She returns today for follow-up.   REVIEW OF SYSTEMS: Out of a complete 14 system review of symptoms, the patient complains only of the following symptoms, and all other reviewed systems are negative.  See HPI  ALLERGIES: Allergies  Allergen Reactions  . Decadron [Dexamethasone] Shortness Of Breath and Palpitations    "RONDEC"  . Amoxicillin Hives    penicillin    HOME MEDICATIONS: Outpatient Medications Prior to Visit  Medication Sig Dispense Refill  . cetirizine (ZYRTEC) 10 MG tablet Take 10 mg by mouth at bedtime.     Marland Kitchen  FLUoxetine (PROZAC) 40 MG capsule Take 40 mg by mouth daily.  0  . ibuprofen (ADVIL,MOTRIN) 200 MG tablet Take 800 mg by mouth every 6 (six) hours as needed.    . topiramate (TOPAMAX) 25 MG tablet TAKE 2 TABLETS BY MOUTH ONCE DAILY 60 tablet 0   No facility-administered medications prior to visit.     PAST MEDICAL HISTORY: Past Medical History:  Diagnosis Date  . Anxiety   . Depression   . Diplopia    bilateral  . Esotropia, congenital    bilateral persistant incommitant  . Migraine   . Seasonal allergies     PAST SURGICAL HISTORY: Past Surgical History:  Procedure Laterality Date  . ADJUSTABLE SUTURE MANIPULATION Left 09/15/2015   Procedure: LEFT EYE ADJUSTABLE SUTURE MANIPULATION;  Surgeon: Aura Camps, MD;  Location: [facility];  Service: Ophthalmology;  Laterality: Left;  Marland Kitchen MUSCLE RECESSION AND RESECTION Bilateral 09/15/2015   Procedure: RIGHT MEDIAL RECTUS POSTERIOR FIXATION SUTURE,LEFT LATERAL RECTUS RESECTION WITH ADJUSTABLE SUTURE ;  Surgeon: Aura Camps, MD;  Location: [facility];  Service: Ophthalmology;  Laterality: Bilateral;  . ORIF RIGHT WRIST FX  01/ 2010   and Carpal Tunnel Release  . SHOULDER ARTHROSCOPY W/ LABRAL REPAIR Left 09-10-2007   and Debridement  . WISDOM TOOTH EXTRACTION  1989    FAMILY HISTORY: Family History  Problem Relation Age of Onset  . Hypertension Mother   . Thyroid disease Mother   .  Hypertension Father   . Heart disease Father   . Hypertension Brother   . Thyroid disease Sister     SOCIAL HISTORY: Social History   Socioeconomic History  . Marital status: Married    Spouse name: Not on file  . Number of children: Not on file  . Years of education: Not on file  . Highest education level: Not on file  Occupational History  . Not on file  Social Needs  . Financial resource strain: Not on file  . Food insecurity:    Worry: Not on file    Inability: Not on file  . Transportation needs:      Medical: Not on file    Non-medical: Not on file  Tobacco Use  . Smoking status: Never Smoker  . Smokeless tobacco: Never Used  Substance and Sexual Activity  . Alcohol use: Yes    Alcohol/week: 2.4 oz    Types: 4 Glasses of wine per week    Comment: OCCASIONAL  . Drug use: No  . Sexual activity: Yes    Birth control/protection: None  Lifestyle  . Physical activity:    Days per week: Not on file    Minutes per session: Not on file  . Stress: Not on file  Relationships  . Social connections:    Talks on phone: Not on file    Gets together: Not on file    Attends religious service: Not on file    Active member of club or organization: Not on file    Attends meetings of clubs or organizations: Not on file    Relationship status: Not on file  . Intimate partner violence:    Fear of current or ex partner: Not on file    Emotionally abused: Not on file    Physically abused: Not on file    Forced sexual activity: Not on file  Other Topics Concern  . Not on file  Social History Narrative  . Not on file      PHYSICAL EXAM  Vitals:   09/26/17 0742  BP: (!) 89/55  Pulse: 64  Weight: 149 lb 6.4 oz (67.8 kg)   Body mass index is 26.47 kg/m.  Generalized: Well developed, in no acute distress   Neurological examination  Mentation: Alert oriented to time, place, history taking. Follows all commands speech and language fluent Cranial nerve II-XII: Pupils were equal round reactive to light. Extraocular movements were full, visual field were full on confrontational test. Facial sensation and strength were normal. Uvula tongue midline. Head turning and shoulder shrug  were normal and symmetric. Motor: The motor testing reveals 5 over 5 strength of all 4 extremities. Good symmetric motor tone is noted throughout.  Sensory: Sensory testing is intact to soft touch on all 4 extremities. No evidence of extinction is noted.  Coordination: Cerebellar testing reveals good  finger-nose-finger and heel-to-shin bilaterally.  Gait and station: Gait is normal. Tandem gait is normal. Romberg is negative. No drift is seen.  Reflexes: Deep tendon reflexes are symmetric and normal bilaterally.   DIAGNOSTIC DATA (LABS, IMAGING, TESTING) - I reviewed patient records, labs, notes, testing and imaging myself where available.  Lab Results  Component Value Date   WBC 9.6 07/27/2011   HGB 11.7 (L) 09/15/2015   HCT 37.5 07/27/2011   MCV 89.5 07/27/2011   PLT 322 07/27/2011   Lab Results  Component Value Date   VITAMINB12 309 05/27/2015   Lab Results  Component Value Date   TSH  2.290 05/27/2015      ASSESSMENT AND PLAN 47 y.o. year old female  has a past medical history of Anxiety, Depression, Diplopia, Esotropia, congenital, Migraine, and Seasonal allergies. here with:  1.  Migraine headaches  The patient will continue on Topamax 50 mg at bedtime.  Her headaches are under good control at this time.  She is advised that if her symptoms worsen or she develops new symptoms she should let us know.  She will follow-up in 6 months or sooner if needed.   I spent 15 minutes with the patient. 50% of this time was spent reviewing her medication   Butch Penny, MSN, NP-C 09/26/2017, 7:32 AM [facility] [address] [place], [place] [address] [phone]

## 2017-09-26 NOTE — Patient Instructions (Signed)
Your Plan:  Continue Topamax 50 mg at bedtime If your symptoms worsen or you develop new symptoms please let us know.    Thank you for coming to see us at [facility]. I hope we have been able to provide you high quality care today.  You may receive a patient satisfaction survey over the next few weeks. We would appreciate your feedback and comments so that we may continue to improve ourselves and the health of our patients.  

## 2017-09-26 NOTE — Progress Notes (Signed)
I have read the note, and I agree with the clinical assessment and plan.  Charles K Willis   

## 2017-10-08 DIAGNOSIS — Z0000 Encounter for general adult medical examination without abnormal findings: Secondary | ICD-10-CM | POA: Diagnosis not present

## 2017-10-08 DIAGNOSIS — Z13220 Encounter for screening for lipoid disorders: Secondary | ICD-10-CM | POA: Diagnosis not present

## 2017-10-08 DIAGNOSIS — E559 Vitamin D deficiency, unspecified: Secondary | ICD-10-CM | POA: Diagnosis not present

## 2017-10-08 DIAGNOSIS — R51 Headache: Secondary | ICD-10-CM | POA: Diagnosis not present

## 2017-11-07 DIAGNOSIS — E559 Vitamin D deficiency, unspecified: Secondary | ICD-10-CM | POA: Diagnosis not present

## 2017-11-07 DIAGNOSIS — Z23 Encounter for immunization: Secondary | ICD-10-CM | POA: Diagnosis not present

## 2018-05-08 ENCOUNTER — Encounter: Payer: Self-pay | Admitting: Adult Health

## 2018-05-20 IMAGING — CT CT CHEST W/ CM
2 of 5 series · 11 of 36 positions shown, 13 images · IV contrast (APPLIED)
Comparison: Chest x-ray 08/19/2015.

CLINICAL DATA: Pulmonary nodule on chest x-ray.

EXAM:
CT CHEST WITH CONTRAST
TECHNIQUE: Multidetector CT imaging of the chest was performed during
intravenous contrast administration.
CONTRAST:  75mL [id number] IOPAMIDOL ([id number]) INJECTION 61%

[Series 2: chest w/cm · axial · 0.62mm/px · z∈[+872,+1102]mm · 8 of 149 slices shown, 10 images]
[im 17/149  mediastinal]
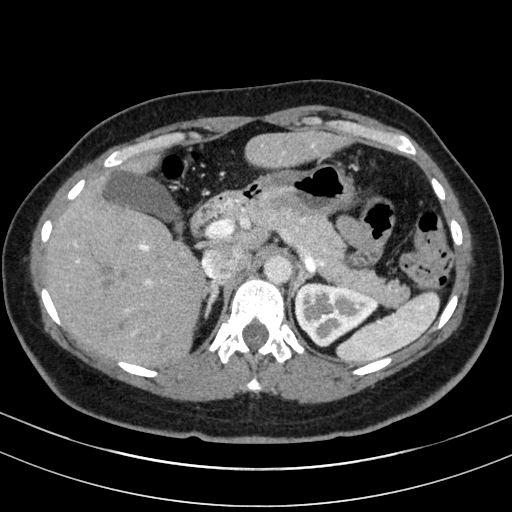
[im 17/149  lung]
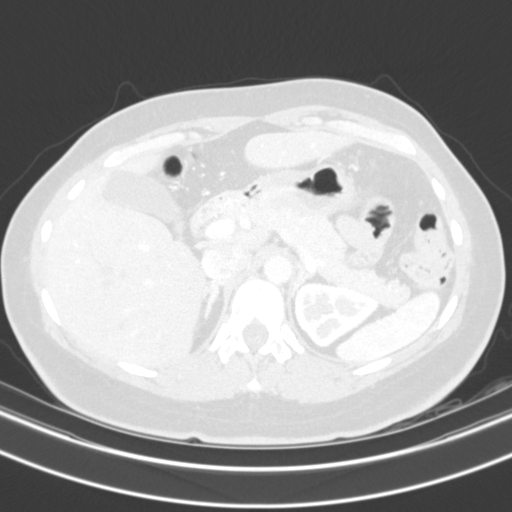
[im 33/149  lung]
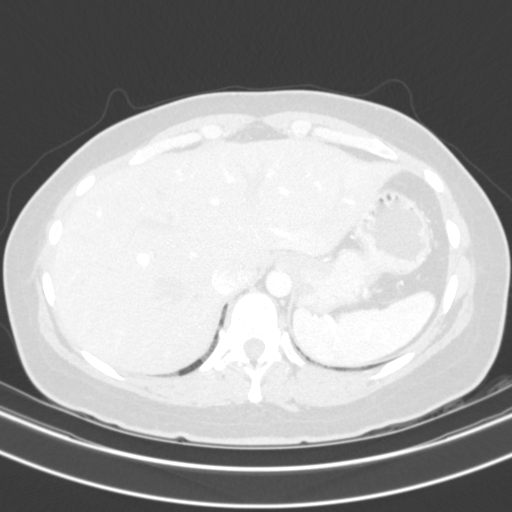
[im 50/149  lung]
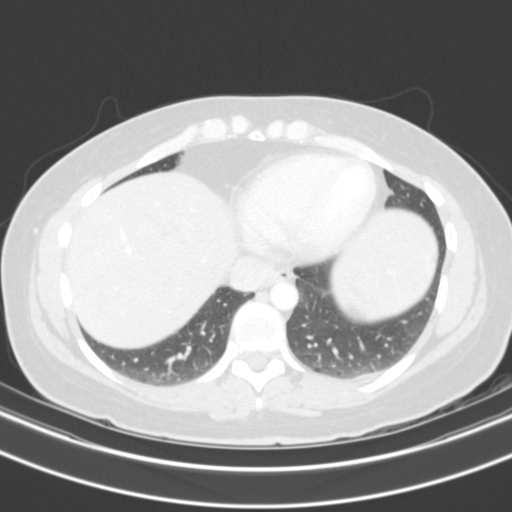
[im 66/149  lung]
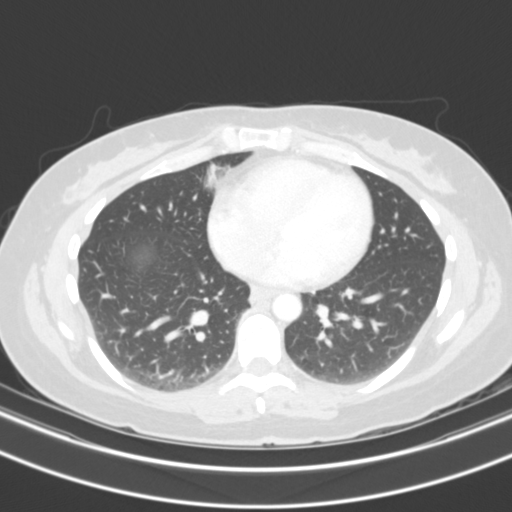
[im 83/149  mediastinal]
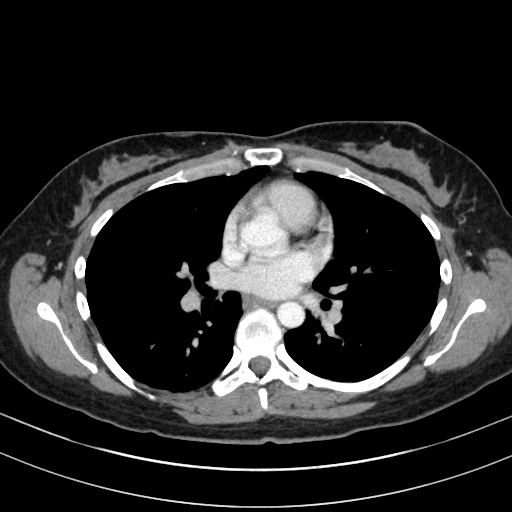
[im 83/149  lung]
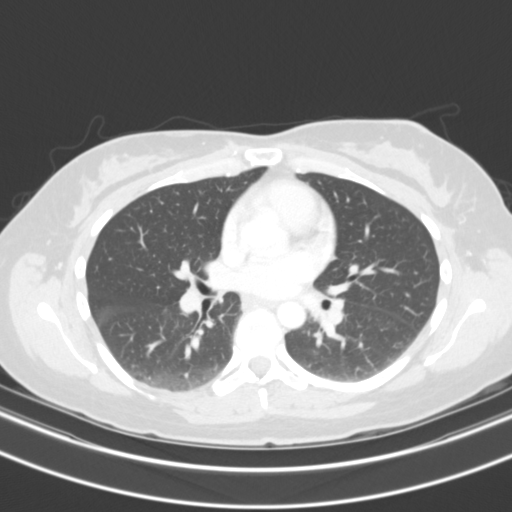
[im 99/149  lung]
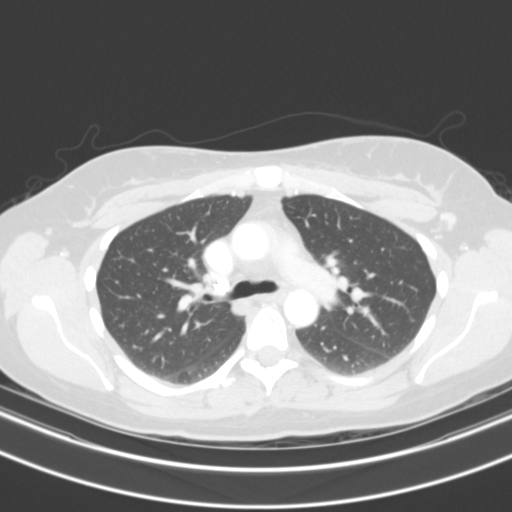
[im 116/149  lung]
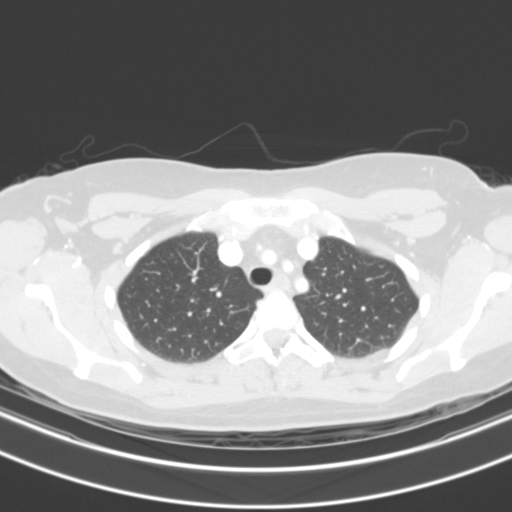
[im 132/149  lung]
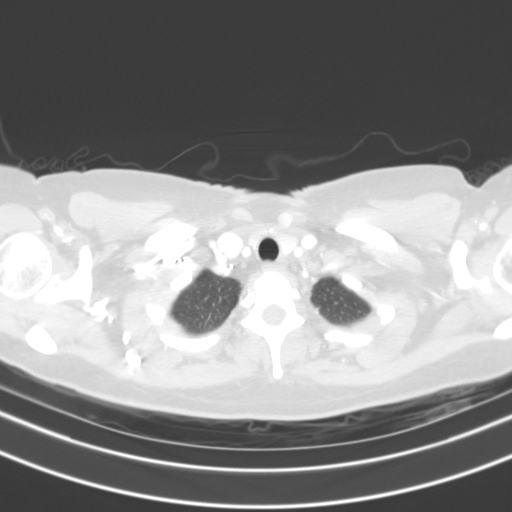

[Series 3: cor · coronal · 0.59mm/px · 3 of 105 slices shown]
[im 21/105  lung]
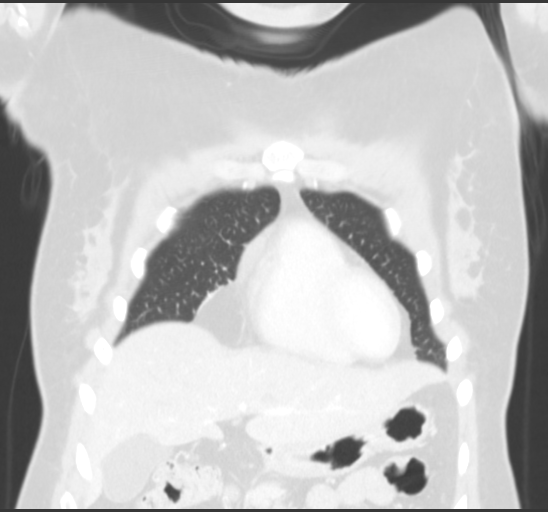
[im 42/105  lung]
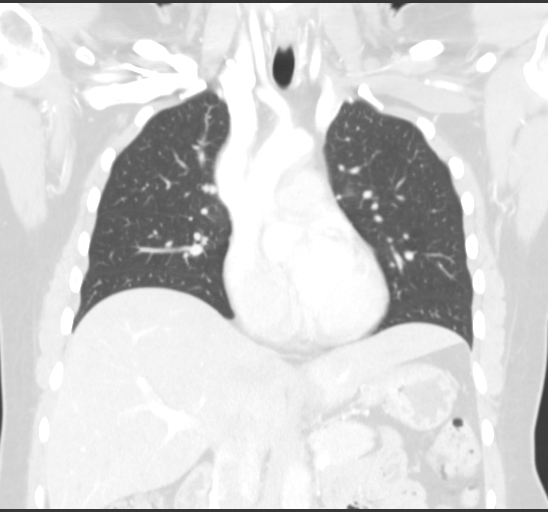
[im 63/105  lung]
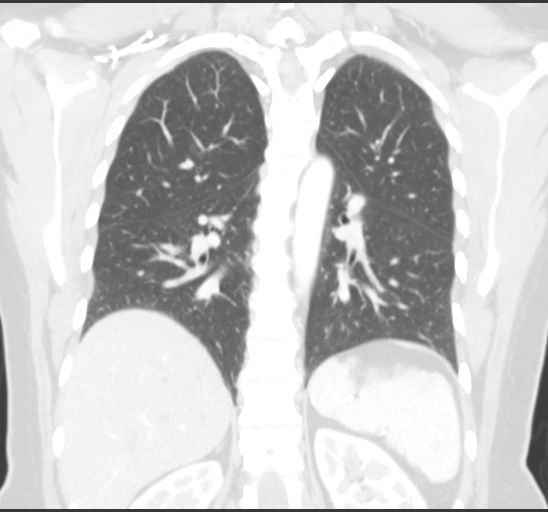

[11 of 36 positions shown; findings below may reference images not displayed]

FINDINGS: Mediastinum / Lymph Nodes: There is no axillary lymphadenopathy. No
mediastinal lymphadenopathy. There is no hilar lymphadenopathy. The
heart size is normal. No pericardial effusion. The esophagus has
normal imaging features.

Lungs / Pleura: No evidence for focal airspace consolidation. No
pulmonary nodule or mass. No pulmonary edema or pleural effusion.
The opacity in the right cardiophrenic angle on the recent chest
x-ray is secondary to prominent epicardial fat pad.

Upper Abdomen:  Unremarkable.

[HOSPITAL] / Soft Tissues: Bone windows reveal no worrisome lytic or
sclerotic osseous lesions.
IMPRESSION: Normal CT evaluation of the chest. The opacity in the right
cardiophrenic angle seen on the recent chest x-ray is secondary to
an epicardial fat pad.

## 2018-10-02 ENCOUNTER — Ambulatory Visit: Payer: 59 | Admitting: Adult Health

## 2018-10-11 ENCOUNTER — Other Ambulatory Visit: Payer: Self-pay | Admitting: Adult Health

## 2018-11-19 NOTE — Progress Notes (Signed)
PATIENT: Natalie Hurst DOB: [birth year]  REASON FOR VISIT: follow up HISTORY FROM: patient  HISTORY OF PRESENT ILLNESS: Today 11/20/18  Natalie Hurst is a 48 year old female with history of headaches.  She remains on Topamax 50 mg at bedtime.  Since last seen, she reports an increase in her headaches.  She reports they are occurring twice a week, always to the top of the right side of her head.  They are mild to moderate in severity.  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  The headaches are not significant enough to have to take medication for relief.  She does not have any nausea.  They do not prevent her from doing anything.  She says she actually does not pay much attention to the headaches.  She recently was started on Wellbutrin in addition to Prozac.  She presents today for follow-up unaccompanied.  HISTORY 09/26/2017 MM: Natalie Hurst is a 48 year old female with a history of headaches.  She returns today for follow-up.  She states that her headaches continue to be under good control.  She takes 50 mg at bedtime.  She states that her headaches are very rare.  To the point that she does not know the frequency of her headaches.  She states that when she does get a headache is typically dull and located on the right side of the head.  She typically does not have any associated symptoms.  She denies any new symptoms.  She returns today for evaluation.  REVIEW OF SYSTEMS: Out of a complete 14 system review of symptoms, the patient complains only of the following symptoms, and all other reviewed systems are negative.  Headache  ALLERGIES: Allergies  Allergen Reactions  . Decadron [Dexamethasone] Shortness Of Breath and Palpitations    "RONDEC"  . Amoxicillin Hives    penicillin    HOME MEDICATIONS: Outpatient Medications Prior to Visit  Medication Sig Dispense Refill  . cetirizine (ZYRTEC) 10 MG tablet Take 10 mg by mouth at bedtime.     Marland Kitchen  FLUoxetine (PROZAC) 40 MG capsule Take 40 mg by mouth daily.  0  . ibuprofen (ADVIL,MOTRIN) 200 MG tablet Take 800 mg by mouth every 6 (six) hours as needed.    . topiramate (TOPAMAX) 25 MG tablet TAKE 2 TABLETS(50 MG) BY MOUTH DAILY 180 tablet 0   No facility-administered medications prior to visit.     PAST MEDICAL HISTORY: Past Medical History:  Diagnosis Date  . Anxiety   . Depression   . Diplopia    bilateral  . Esotropia, congenital    bilateral persistant incommitant  . Migraine   . Seasonal allergies     PAST SURGICAL HISTORY: Past Surgical History:  Procedure Laterality Date  . ADJUSTABLE SUTURE MANIPULATION Left 09/15/2015   Procedure: LEFT EYE ADJUSTABLE SUTURE MANIPULATION;  Surgeon: Aura Camps, MD;  Location: [facility];  Service: Ophthalmology;  Laterality: Left;  Marland Kitchen MUSCLE RECESSION AND RESECTION Bilateral 09/15/2015   Procedure: RIGHT MEDIAL RECTUS POSTERIOR FIXATION SUTURE,LEFT LATERAL RECTUS RESECTION WITH ADJUSTABLE SUTURE ;  Surgeon: Aura Camps, MD;  Location: [facility];  Service: Ophthalmology;  Laterality: Bilateral;  . ORIF RIGHT WRIST FX  01/ 2010   and Carpal Tunnel Release  . SHOULDER ARTHROSCOPY W/ LABRAL REPAIR Left 09-10-2007   and Debridement  . WISDOM TOOTH EXTRACTION  1989    FAMILY HISTORY: Family History  Problem Relation Age of Onset  . Hypertension Mother   .  Thyroid disease Mother   . Hypertension Father   . Heart disease Father   . Hypertension Brother   . Thyroid disease Sister     SOCIAL HISTORY: Social History   Socioeconomic History  . Marital status: Married    Spouse name: Not on file  . Number of children: Not on file  . Years of education: Not on file  . Highest education level: Not on file  Occupational History  . Not on file  Social Needs  . Financial resource strain: Not on file  . Food insecurity    Worry: Not on file    Inability: Not on file  . Transportation needs     Medical: Not on file    Non-medical: Not on file  Tobacco Use  . Smoking status: Never Smoker  . Smokeless tobacco: Never Used  Substance and Sexual Activity  . Alcohol use: Yes    Alcohol/week: 4.0 standard drinks    Types: 4 Glasses of wine per week    Comment: OCCASIONAL  . Drug use: No  . Sexual activity: Yes    Birth control/protection: None  Lifestyle  . Physical activity    Days per week: Not on file    Minutes per session: Not on file  . Stress: Not on file  Relationships  . Social Herbalist on phone: Not on file    Gets together: Not on file    Attends religious service: Not on file    Active member of club or organization: Not on file    Attends meetings of clubs or organizations: Not on file    Relationship status: Not on file  . Intimate partner violence    Fear of current or ex partner: Not on file    Emotionally abused: Not on file    Physically abused: Not on file    Forced sexual activity: Not on file  Other Topics Concern  . Not on file  Social History Narrative  . Not on file    PHYSICAL EXAM  Vitals:   11/20/18 1240  Height: 5\' 3"  (1.6 m)   Body mass index is 26.47 kg/m.  Generalized: Well developed, in no acute distress   Neurological examination  Mentation: Alert oriented to time, place, history taking. Follows all commands speech and language fluent Cranial nerve II-XII: Pupils were equal round reactive to light. Extraocular movements were full, visual field were full on confrontational test. Facial sensation and strength were normal.  Head turning and shoulder shrug  were normal and symmetric. Motor: The motor testing reveals 5 over 5 strength of all 4 extremities. Good symmetric motor tone is noted throughout.  Sensory: Sensory testing is intact to soft touch on all 4 extremities. No evidence of extinction is noted.  Coordination: Cerebellar testing reveals good finger-nose-finger and heel-to-shin bilaterally.  Gait and  station: Gait is normal. Tandem gait is normal. Romberg is negative. No drift is seen.  Reflexes: Deep tendon reflexes are symmetric and normal bilaterally.   DIAGNOSTIC DATA (LABS, IMAGING, TESTING) - I reviewed patient records, labs, notes, testing and imaging myself where available.  Lab Results  Component Value Date   WBC 9.6 07/27/2011   HGB 11.7 (L) 09/15/2015   HCT 37.5 07/27/2011   MCV 89.5 07/27/2011   PLT 322 07/27/2011   No results found for: NA, K, CL, CO2, GLUCOSE, BUN, CREATININE, CALCIUM, PROT, ALBUMIN, AST, ALT, ALKPHOS, BILITOT, GFRNONAA, GFRAA No results found for: CHOL, HDL, LDLCALC, LDLDIRECT, TRIG,  CHOLHDL No results found for: [id number] Lab Results  Component Value Date   VITAMINB12 309 05/27/2015   Lab Results  Component Value Date   TSH 2.290 05/27/2015      ASSESSMENT AND PLAN 48 y.o. year old female  has a past medical history of Anxiety, Depression, Diplopia, Esotropia, congenital, Migraine, and Seasonal allergies. here with:  1.  Migraine headache  She reports a slight increase in her headaches since last visit.  She reports on average she is having 2 mild to moderate headaches a week.  They do not impact her daily life or require medication intervention.  We will try an increase on Topamax taking 75 mg at bedtime.  Going forward, she follow-up at this office on an as-needed basis. She has close follow-up with her primary care doctor, Dr. Hyacinth MeekerMiller. She thinks stress has been an aggravating factor for her headaches.  She has done quite well on Topamax since 2017.  I spent 15 minutes with the patient. 50% of this time was spent discussing her plan of care.  Margie EgeSarah Zahirah Cheslock, AGNP-C, DNP 11/20/2018, 12:41 PM [facility] [address], [address] [place], [place] [id number] [phone]

## 2018-11-20 ENCOUNTER — Other Ambulatory Visit: Payer: Self-pay

## 2018-11-20 ENCOUNTER — Encounter: Payer: Self-pay | Admitting: Neurology

## 2018-11-20 ENCOUNTER — Ambulatory Visit: Payer: 59 | Admitting: Neurology

## 2018-11-20 VITALS — BP 97/67 | HR 69 | Temp 97.7°F | Ht 63.0 in | Wt 155.8 lb

## 2018-11-20 DIAGNOSIS — G441 Vascular headache, not elsewhere classified: Secondary | ICD-10-CM | POA: Diagnosis not present

## 2018-11-20 MED ORDER — TOPIRAMATE 25 MG PO TABS: ORAL_TABLET | ORAL | 3 refills | Status: AC

## 2018-11-20 NOTE — Patient Instructions (Signed)
Increase Topamax 75 mg at bedtime

## 2018-11-20 NOTE — Progress Notes (Signed)
I have read the note, and I agree with the clinical assessment and plan.  Charles K Willis   

## 2019-04-26 ENCOUNTER — Ambulatory Visit: Payer: 59 | Attending: Internal Medicine

## 2019-04-26 DIAGNOSIS — Z23 Encounter for immunization: Secondary | ICD-10-CM | POA: Insufficient documentation

## 2019-04-26 NOTE — Progress Notes (Signed)
   Covid-19 Vaccination Clinic  Name:  Natalie Hurst    MRN: [id number] DOB: 1970/12/20  04/26/2019  Natalie Hurst was observed post Covid-19 immunization for 15 minutes without incidence. She was provided with Vaccine Information Sheet and instruction to access the V-Safe system.   Natalie Hurst was instructed to call 911 with any severe reactions post vaccine: Marland Kitchen Difficulty breathing  . Swelling of your face and throat  . A fast heartbeat  . A bad rash all over your body  . Dizziness and weakness    Immunizations Administered    Name Date Dose VIS Date Route   Pfizer COVID-19 Vaccine 04/26/2019  9:06 AM 0.3 mL 02/14/2019 Intramuscular   Manufacturer: [employer], [employer]   Lot: [id number]   NDC: [address]-0

## 2019-05-19 ENCOUNTER — Ambulatory Visit: Payer: 59 | Attending: Internal Medicine

## 2019-05-19 DIAGNOSIS — Z23 Encounter for immunization: Secondary | ICD-10-CM

## 2019-05-19 NOTE — Progress Notes (Signed)
   Covid-19 Vaccination Clinic  Name:  Natalie Hurst    MRN: [id number] DOB: March 13, 1970  05/19/2019  [name] was observed post Covid-19 immunization for 15 minutes without incident. She was provided with Vaccine Information Sheet and instruction to access the V-Safe system.   [name] was instructed to call 911 with any severe reactions post vaccine: Marland Kitchen Difficulty breathing  . Swelling of face and throat  . A fast heartbeat  . A bad rash all over body  . Dizziness and weakness   Immunizations Administered    Name Date Dose VIS Date Route   Pfizer COVID-19 Vaccine 05/19/2019  8:27 AM 0.3 mL 02/14/2019 Intramuscular   Manufacturer: [employer], [employer]   Lot: [id number]   NDC: [address]-4

## 2021-02-16 DIAGNOSIS — N939 Abnormal uterine and vaginal bleeding, unspecified: Secondary | ICD-10-CM | POA: Diagnosis not present

## 2021-02-16 DIAGNOSIS — N840 Polyp of corpus uteri: Secondary | ICD-10-CM | POA: Diagnosis not present

## 2021-03-11 DIAGNOSIS — N920 Excessive and frequent menstruation with regular cycle: Secondary | ICD-10-CM | POA: Diagnosis not present

## 2021-08-15 DIAGNOSIS — D261 Other benign neoplasm of corpus uteri: Secondary | ICD-10-CM | POA: Diagnosis not present

## 2021-08-16 DIAGNOSIS — N6320 Unspecified lump in the left breast, unspecified quadrant: Secondary | ICD-10-CM | POA: Diagnosis not present

## 2021-08-16 DIAGNOSIS — N8501 Benign endometrial hyperplasia: Secondary | ICD-10-CM | POA: Diagnosis not present

## 2021-10-04 DIAGNOSIS — R922 Inconclusive mammogram: Secondary | ICD-10-CM | POA: Diagnosis not present

## 2021-10-04 DIAGNOSIS — N6002 Solitary cyst of left breast: Secondary | ICD-10-CM | POA: Diagnosis not present

## 2021-10-04 DIAGNOSIS — R928 Other abnormal and inconclusive findings on diagnostic imaging of breast: Secondary | ICD-10-CM | POA: Diagnosis not present

## 2021-12-30 DIAGNOSIS — Z124 Encounter for screening for malignant neoplasm of cervix: Secondary | ICD-10-CM | POA: Diagnosis not present

## 2021-12-30 DIAGNOSIS — Z01419 Encounter for gynecological examination (general) (routine) without abnormal findings: Secondary | ICD-10-CM | POA: Diagnosis not present

## 2021-12-30 DIAGNOSIS — N841 Polyp of cervix uteri: Secondary | ICD-10-CM | POA: Diagnosis not present

## 2021-12-30 DIAGNOSIS — N8501 Benign endometrial hyperplasia: Secondary | ICD-10-CM | POA: Diagnosis not present

## 2021-12-30 DIAGNOSIS — Z1231 Encounter for screening mammogram for malignant neoplasm of breast: Secondary | ICD-10-CM | POA: Diagnosis not present

## 2022-01-09 DIAGNOSIS — F411 Generalized anxiety disorder: Secondary | ICD-10-CM | POA: Diagnosis not present

## 2022-01-09 DIAGNOSIS — Z0000 Encounter for general adult medical examination without abnormal findings: Secondary | ICD-10-CM | POA: Diagnosis not present

## 2022-01-09 DIAGNOSIS — Z79899 Other long term (current) drug therapy: Secondary | ICD-10-CM | POA: Diagnosis not present

## 2022-01-09 DIAGNOSIS — J309 Allergic rhinitis, unspecified: Secondary | ICD-10-CM | POA: Diagnosis not present

## 2022-01-09 DIAGNOSIS — Z13220 Encounter for screening for lipoid disorders: Secondary | ICD-10-CM | POA: Diagnosis not present

## 2022-01-09 DIAGNOSIS — G43009 Migraine without aura, not intractable, without status migrainosus: Secondary | ICD-10-CM | POA: Diagnosis not present

## 2022-01-09 DIAGNOSIS — N926 Irregular menstruation, unspecified: Secondary | ICD-10-CM | POA: Diagnosis not present

## 2022-01-09 DIAGNOSIS — E669 Obesity, unspecified: Secondary | ICD-10-CM | POA: Diagnosis not present

## 2022-01-09 DIAGNOSIS — F324 Major depressive disorder, single episode, in partial remission: Secondary | ICD-10-CM | POA: Diagnosis not present

## 2022-01-09 DIAGNOSIS — E559 Vitamin D deficiency, unspecified: Secondary | ICD-10-CM | POA: Diagnosis not present

## 2022-02-16 DIAGNOSIS — N8500 Endometrial hyperplasia, unspecified: Secondary | ICD-10-CM | POA: Diagnosis not present

## 2022-02-16 DIAGNOSIS — N939 Abnormal uterine and vaginal bleeding, unspecified: Secondary | ICD-10-CM | POA: Diagnosis not present

## 2022-08-31 DIAGNOSIS — N939 Abnormal uterine and vaginal bleeding, unspecified: Secondary | ICD-10-CM | POA: Diagnosis not present

## 2022-08-31 DIAGNOSIS — N8501 Benign endometrial hyperplasia: Secondary | ICD-10-CM | POA: Diagnosis not present

## 2023-01-12 DIAGNOSIS — Z23 Encounter for immunization: Secondary | ICD-10-CM | POA: Diagnosis not present

## 2023-01-12 DIAGNOSIS — F411 Generalized anxiety disorder: Secondary | ICD-10-CM | POA: Diagnosis not present

## 2023-01-12 DIAGNOSIS — F3340 Major depressive disorder, recurrent, in remission, unspecified: Secondary | ICD-10-CM | POA: Diagnosis not present

## 2023-01-12 DIAGNOSIS — G43009 Migraine without aura, not intractable, without status migrainosus: Secondary | ICD-10-CM | POA: Diagnosis not present

## 2023-01-12 DIAGNOSIS — E669 Obesity, unspecified: Secondary | ICD-10-CM | POA: Diagnosis not present

## 2023-01-12 DIAGNOSIS — Z0000 Encounter for general adult medical examination without abnormal findings: Secondary | ICD-10-CM | POA: Diagnosis not present

## 2023-01-18 DIAGNOSIS — Z124 Encounter for screening for malignant neoplasm of cervix: Secondary | ICD-10-CM | POA: Diagnosis not present

## 2023-01-18 DIAGNOSIS — Z01419 Encounter for gynecological examination (general) (routine) without abnormal findings: Secondary | ICD-10-CM | POA: Diagnosis not present

## 2023-01-18 DIAGNOSIS — Z6835 Body mass index (BMI) 35.0-35.9, adult: Secondary | ICD-10-CM | POA: Diagnosis not present

## 2023-01-18 DIAGNOSIS — Z1231 Encounter for screening mammogram for malignant neoplasm of breast: Secondary | ICD-10-CM | POA: Diagnosis not present

## 2023-01-29 DIAGNOSIS — R9230 Dense breasts, unspecified: Secondary | ICD-10-CM | POA: Diagnosis not present

## 2023-02-06 DIAGNOSIS — R9230 Dense breasts, unspecified: Secondary | ICD-10-CM | POA: Diagnosis not present

## 2023-10-03 DIAGNOSIS — S60519A Abrasion of unspecified hand, initial encounter: Secondary | ICD-10-CM | POA: Diagnosis not present

## 2024-01-15 DIAGNOSIS — Z131 Encounter for screening for diabetes mellitus: Secondary | ICD-10-CM | POA: Diagnosis not present

## 2024-01-15 DIAGNOSIS — E669 Obesity, unspecified: Secondary | ICD-10-CM | POA: Diagnosis not present

## 2024-01-15 DIAGNOSIS — Z13220 Encounter for screening for lipoid disorders: Secondary | ICD-10-CM | POA: Diagnosis not present

## 2024-01-15 DIAGNOSIS — Z0000 Encounter for general adult medical examination without abnormal findings: Secondary | ICD-10-CM | POA: Diagnosis not present

## 2024-01-15 DIAGNOSIS — F3340 Major depressive disorder, recurrent, in remission, unspecified: Secondary | ICD-10-CM | POA: Diagnosis not present

## 2024-01-15 DIAGNOSIS — G43009 Migraine without aura, not intractable, without status migrainosus: Secondary | ICD-10-CM | POA: Diagnosis not present

## 2024-01-15 DIAGNOSIS — K625 Hemorrhage of anus and rectum: Secondary | ICD-10-CM | POA: Diagnosis not present
